# Patient Record
Sex: Female | Born: 1965
Health system: Southern US, Community
[De-identification: ages and names within clinical notes are randomized; demographics above are authoritative.]

## PROBLEM LIST (undated history)

## (undated) DIAGNOSIS — M2550 Pain in unspecified joint: Secondary | ICD-10-CM

## (undated) DIAGNOSIS — K5900 Constipation, unspecified: Secondary | ICD-10-CM

## (undated) HISTORY — DX: Constipation, unspecified: K59.00

## (undated) HISTORY — DX: Pain in unspecified joint: M25.50

---

## 2013-11-23 ENCOUNTER — Encounter: Payer: 59 | Attending: "Endocrinology | Admitting: Dietician

## 2013-11-23 DIAGNOSIS — E669 Obesity, unspecified: Secondary | ICD-10-CM | POA: Diagnosis not present

## 2013-11-23 DIAGNOSIS — Z713 Dietary counseling and surveillance: Secondary | ICD-10-CM | POA: Insufficient documentation

## 2013-11-23 NOTE — Progress Notes (Signed)
Patient was seen on 11/23/2013 for the Weight Loss Class at the [facility]. The following learning objectives were met by the patient during this class:   Describe healthy choices in each food group  Describe portion size of foods  Use plate method for meal planning  Demonstrate how to read Nutrition Facts food label  Set realistic goals for weight loss, diet changes, and physical activity.   Goals:  1. Make healthy food choices in each food group.  2. Reduce portion size of foods.  3. Increase fruit and vegetable intake.  4. Use plate method for meal planning.  5. Increase physical activity.   Handouts given:  1. Weight loss tips 2. Meal plan/portion card 2. Plate method  2. Food label handout

## 2013-12-31 ENCOUNTER — Encounter: Payer: 59 | Attending: "Endocrinology | Admitting: Dietician

## 2013-12-31 ENCOUNTER — Encounter: Payer: Self-pay | Admitting: Dietician

## 2013-12-31 VITALS — Ht 64.0 in | Wt 165.1 lb

## 2013-12-31 DIAGNOSIS — Z713 Dietary counseling and surveillance: Secondary | ICD-10-CM | POA: Diagnosis present

## 2013-12-31 DIAGNOSIS — E669 Obesity, unspecified: Secondary | ICD-10-CM | POA: Diagnosis not present

## 2013-12-31 DIAGNOSIS — E663 Overweight: Secondary | ICD-10-CM

## 2013-12-31 NOTE — Patient Instructions (Signed)
Follow Pre-Op Diet for 2-4 weeks. Add snacks with protein and carbohydrate if you are feeling hungry. When possible, try eating slowly, chewing well, and paying attention to hunger/fullness cues. Try to eat snacks when feeling hungry.  Try to sleep 7 hours per night.

## 2013-12-31 NOTE — Progress Notes (Signed)
  Medical Nutrition Therapy:  Appt start time: 469 end time:  6295.   Assessment:  Primary concerns today: Kelly Rivers is here today since she would like to lose weight. Currently going through menopause. Pretty active tends to eat more after work when she is busy. Used to weigh about 160 lbs and recently gained 5 lbs. Weight loss goal is about 150 lbs. Feels like she eats healthy 75% of the time.   Started working at [employer] 5 months ago and attended the Saturday Weight Management Class in August. 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 Lives husband and 2 kids at home. She does the food shopping and meal preparation at home. Does not miss or skip any meals. Eats out about 3-4 x week and has lunch at work [employer]. Has been eating the same way for 9 years. Just started working out with trainer and gained 5 lbs. Frustrated that scale has not moved.   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 Sleeps usually less than 7 hours a night.   Preferred Learning Style:   No preference indicated   Learning Readiness:   Ready  MEDICATIONS: none   DIETARY INTAKE:  Usual eating pattern includes 3 meals and 2 snacks per day.  Avoided foods include red meat, , pork, brussels sprouts, beets, lima beans, kale   24-hr recall:  B ( AM): 2 scrambled eggs or vanilla [place] yogurt with coffee with half and half and will juice vegetables and 1 apples 2-3 x week  Snk ( AM):none L ( PM): cafeteria will have salad with mixed greens, carrots, cucumbers, peppers, sunflower seeds, raisins, with ranch dressing and 2 chicken strips Snk ( PM): 4 pack of peanut butter crackers or crackers or yogurt or Kombucha (for constipation) Snk ( PM): 1/2 protein bar, or peanut butter cracker with 32 oz Diet Coke on her drive D ( PM): home - deer meat hamburger tacos, spaghetti, chicken or Wendy's chicken sandwich or McDonald's chicken salad  Snk ( PM): may have something sweet, popcorn, chips, pita chips  and humus, almonds, cashews Beverages: water, coffee, Diet Coke  Usual physical activity: tennis 3 x week for 1.5-2 hours, trainer 2 x week for 30 minutes, active at home   Estimated energy needs: 1800 calories 200 g carbohydrates 135 g protein 50 g fat  Progress Towards Goal(s):  In progress.   Nutritional Diagnosis:  [place]-3.3 Overweight/obesity As related to menopause, stress, and fast food dinners.  As evidenced by BMI of 28.3.    Intervention:  Nutrition counseling provided. Plan: Follow Pre-Op Diet for 2-4 weeks. Add snacks with protein and carbohydrate if you are feeling hungry. When possible, try eating slowly, chewing well, and paying attention to hunger/fullness cues. Try to eat snacks when feeling hungry.  Try to sleep 7 hours per night.   Teaching Method Utilized:  Visual Auditory Hands on  Handouts given during visit include:  Pre-OP Diet  Protein Shakes  Barriers to learning/adherence to lifestyle change: busy schedule, stress  Demonstrated degree of understanding via:  Teach Back   Monitoring/Evaluation:  Dietary intake, exercise, and body weight in 4 week(s).

## 2015-07-17 DIAGNOSIS — L570 Actinic keratosis: Secondary | ICD-10-CM | POA: Diagnosis not present

## 2015-07-17 DIAGNOSIS — L821 Other seborrheic keratosis: Secondary | ICD-10-CM | POA: Diagnosis not present

## 2015-07-17 DIAGNOSIS — D225 Melanocytic nevi of trunk: Secondary | ICD-10-CM | POA: Diagnosis not present

## 2015-07-17 DIAGNOSIS — L812 Freckles: Secondary | ICD-10-CM | POA: Diagnosis not present

## 2015-07-17 DIAGNOSIS — D485 Neoplasm of uncertain behavior of skin: Secondary | ICD-10-CM | POA: Diagnosis not present

## 2015-07-17 DIAGNOSIS — C44529 Squamous cell carcinoma of skin of other part of trunk: Secondary | ICD-10-CM | POA: Diagnosis not present

## 2016-06-21 DIAGNOSIS — Z1231 Encounter for screening mammogram for malignant neoplasm of breast: Secondary | ICD-10-CM | POA: Diagnosis not present

## 2016-06-21 DIAGNOSIS — Z124 Encounter for screening for malignant neoplasm of cervix: Secondary | ICD-10-CM | POA: Diagnosis not present

## 2016-06-21 DIAGNOSIS — Z01419 Encounter for gynecological examination (general) (routine) without abnormal findings: Secondary | ICD-10-CM | POA: Diagnosis not present

## 2016-07-14 DIAGNOSIS — D2271 Melanocytic nevi of right lower limb, including hip: Secondary | ICD-10-CM | POA: Diagnosis not present

## 2016-07-14 DIAGNOSIS — L814 Other melanin hyperpigmentation: Secondary | ICD-10-CM | POA: Diagnosis not present

## 2016-07-14 DIAGNOSIS — L812 Freckles: Secondary | ICD-10-CM | POA: Diagnosis not present

## 2016-07-14 DIAGNOSIS — L853 Xerosis cutis: Secondary | ICD-10-CM | POA: Diagnosis not present

## 2016-07-14 DIAGNOSIS — Z85828 Personal history of other malignant neoplasm of skin: Secondary | ICD-10-CM | POA: Diagnosis not present

## 2016-07-14 DIAGNOSIS — L821 Other seborrheic keratosis: Secondary | ICD-10-CM | POA: Diagnosis not present

## 2017-07-14 DIAGNOSIS — L821 Other seborrheic keratosis: Secondary | ICD-10-CM | POA: Diagnosis not present

## 2017-07-14 DIAGNOSIS — Z85828 Personal history of other malignant neoplasm of skin: Secondary | ICD-10-CM | POA: Diagnosis not present

## 2017-07-14 DIAGNOSIS — D225 Melanocytic nevi of trunk: Secondary | ICD-10-CM | POA: Diagnosis not present

## 2017-07-14 DIAGNOSIS — L812 Freckles: Secondary | ICD-10-CM | POA: Diagnosis not present

## 2017-10-06 ENCOUNTER — Ambulatory Visit (INDEPENDENT_AMBULATORY_CARE_PROVIDER_SITE_OTHER): Payer: Self-pay | Admitting: Medical

## 2017-10-06 ENCOUNTER — Encounter: Payer: Self-pay | Admitting: Medical

## 2017-10-06 VITALS — BP 106/66 | HR 71 | Temp 98.3°F | Resp 16 | Ht 64.0 in | Wt 173.0 lb

## 2017-10-06 DIAGNOSIS — Z0000 Encounter for general adult medical examination without abnormal findings: Secondary | ICD-10-CM

## 2017-10-06 NOTE — Patient Instructions (Signed)

## 2017-10-06 NOTE — Progress Notes (Signed)
Subjective:    Patient ID: Kelly Rivers, female    DOB: 1965-05-16, 52 y.o.   MRN: [id number]  HPI 52 yo female in non acute distress. Here for wellness exam. No complaints today.    No medical problems. No history of  Surgery.  Blood pressure 106/66, pulse 71, temperature 98.3 F (36.8 C), temperature source Oral, resp. rate 16, height 5\' 4"  (1.626 m), weight 173 lb (78.5 kg), SpO2 95 %.  Review of Systems  Constitutional: Negative.   HENT: Negative.   Eyes: Negative.   Respiratory: Negative.   Cardiovascular: Negative.   Gastrointestinal: Negative.   Endocrine: Negative.   Genitourinary: Negative.   Musculoskeletal: Positive for arthralgias (left knee sore from playing tennis this week played  6 days in a row).  Skin: Negative.   Allergic/Immunologic: Negative.   Neurological: Negative.   Hematological: Negative.   Psychiatric/Behavioral: Negative.        Objective:   Physical Exam  Constitutional: She is oriented to person, place, and time. Vital signs are normal. She appears well-developed and well-nourished.  HENT:  Head: Normocephalic and atraumatic.  Right Ear: Hearing, external ear and ear canal normal. No middle ear effusion.  Left Ear: Hearing, external ear and ear canal normal. A middle ear effusion is present.  Nose: Nose normal.  Mouth/Throat: Oropharynx is clear and moist.  Eyes: Pupils are equal, round, and reactive to light. Conjunctivae, EOM and lids are normal.  Neck: Trachea normal and normal range of motion. Neck supple.  Cardiovascular: Normal rate, regular rhythm, normal heart sounds and intact distal pulses.  Pulses:      Carotid pulses are 2+ on the right side.      Radial pulses are 2+ on the right side.       Popliteal pulses are 2+ on the right side, and 2+ on the left side.       Dorsalis pedis pulses are 2+ on the right side, and 2+ on the left side.       Posterior tibial pulses are 2+ on the right side.  Pulmonary/Chest: Effort  normal and breath sounds normal.  Abdominal: Soft. Bowel sounds are normal. There is no hepatosplenomegaly. Hernia confirmed negative in the ventral area.  Musculoskeletal: Normal range of motion.       Right shoulder: Normal.       Left shoulder: Normal.       Right elbow: Normal.      Left elbow: Normal.       Right wrist: Normal.       Left wrist: Normal.       Right hip: Normal.       Left hip: Normal.       Right knee: Normal.       Left knee: Normal.       Right ankle: Normal.       Left ankle: Normal.       Cervical back: Normal.       Thoracic back: Normal.       Lumbar back: Normal.       Right upper arm: Normal.       Left upper arm: Normal.       Right forearm: Normal.       Left forearm: Normal.       Left hand: Normal.       Right upper leg: Normal.       Left upper leg: Normal.       Right lower leg: Normal.  Left lower leg: Normal.  Lymphadenopathy:    She has no cervical adenopathy.  Neurological: She is alert and oriented to person, place, and time.  Skin: Skin is warm and dry. Capillary refill takes less than 2 seconds.  Psychiatric: She has a normal mood and affect. Her behavior is normal. Judgment and thought content normal.  Nursing note and vitals reviewed.         Assessment & Plan:  Wellness exam Follow up with your PCP for annual blood work and ob/gyn doctor for your well woman exam.  Patient verbalizes understanding and has no  Questions at discharge.

## 2017-10-08 ENCOUNTER — Encounter: Payer: Self-pay | Admitting: Dietician

## 2017-10-09 DIAGNOSIS — M1712 Unilateral primary osteoarthritis, left knee: Secondary | ICD-10-CM | POA: Diagnosis not present

## 2017-10-09 DIAGNOSIS — M25569 Pain in unspecified knee: Secondary | ICD-10-CM | POA: Diagnosis not present

## 2017-11-13 DIAGNOSIS — M1711 Unilateral primary osteoarthritis, right knee: Secondary | ICD-10-CM | POA: Diagnosis not present

## 2017-11-13 DIAGNOSIS — M25561 Pain in right knee: Secondary | ICD-10-CM | POA: Diagnosis not present

## 2018-02-20 DIAGNOSIS — Z01419 Encounter for gynecological examination (general) (routine) without abnormal findings: Secondary | ICD-10-CM | POA: Diagnosis not present

## 2018-02-20 DIAGNOSIS — Z1231 Encounter for screening mammogram for malignant neoplasm of breast: Secondary | ICD-10-CM | POA: Diagnosis not present

## 2018-02-20 DIAGNOSIS — R87615 Unsatisfactory cytologic smear of cervix: Secondary | ICD-10-CM | POA: Diagnosis not present

## 2018-02-20 DIAGNOSIS — Z1211 Encounter for screening for malignant neoplasm of colon: Secondary | ICD-10-CM | POA: Diagnosis not present

## 2018-02-20 DIAGNOSIS — Z124 Encounter for screening for malignant neoplasm of cervix: Secondary | ICD-10-CM | POA: Diagnosis not present

## 2018-04-19 DIAGNOSIS — M791 Myalgia, unspecified site: Secondary | ICD-10-CM | POA: Diagnosis not present

## 2018-04-19 DIAGNOSIS — R5383 Other fatigue: Secondary | ICD-10-CM | POA: Diagnosis not present

## 2018-04-19 DIAGNOSIS — Z13220 Encounter for screening for lipoid disorders: Secondary | ICD-10-CM | POA: Diagnosis not present

## 2018-06-15 DIAGNOSIS — M1711 Unilateral primary osteoarthritis, right knee: Secondary | ICD-10-CM | POA: Diagnosis not present

## 2018-07-13 DIAGNOSIS — Z1211 Encounter for screening for malignant neoplasm of colon: Secondary | ICD-10-CM | POA: Diagnosis not present

## 2018-10-02 DIAGNOSIS — Z124 Encounter for screening for malignant neoplasm of cervix: Secondary | ICD-10-CM | POA: Diagnosis not present

## 2018-10-02 DIAGNOSIS — R87610 Atypical squamous cells of undetermined significance on cytologic smear of cervix (ASC-US): Secondary | ICD-10-CM | POA: Diagnosis not present

## 2018-10-02 DIAGNOSIS — Z1231 Encounter for screening mammogram for malignant neoplasm of breast: Secondary | ICD-10-CM | POA: Diagnosis not present

## 2018-10-26 MED FILL — MELOXICAM 7.5 MG TABLET: 7.5 | 60 days supply | Qty: 60 | Fill #0

## 2018-11-06 ENCOUNTER — Other Ambulatory Visit: Payer: Self-pay | Admitting: Obstetrics and Gynecology

## 2018-11-06 DIAGNOSIS — Z1231 Encounter for screening mammogram for malignant neoplasm of breast: Secondary | ICD-10-CM

## 2018-12-19 ENCOUNTER — Other Ambulatory Visit: Payer: Self-pay

## 2018-12-19 ENCOUNTER — Ambulatory Visit
Admission: RE | Admit: 2018-12-19 | Discharge: 2018-12-19 | Disposition: A | Discharge status: Home / Self Care | Payer: 59 | Source: Ambulatory Visit | Attending: Obstetrics and Gynecology | Admitting: Obstetrics and Gynecology

## 2018-12-19 DIAGNOSIS — Z1231 Encounter for screening mammogram for malignant neoplasm of breast: Secondary | ICD-10-CM

## 2019-05-21 ENCOUNTER — Encounter: Payer: Self-pay | Admitting: Sports Medicine

## 2019-05-21 ENCOUNTER — Other Ambulatory Visit: Payer: Self-pay

## 2019-05-21 ENCOUNTER — Ambulatory Visit: Payer: 59 | Admitting: Sports Medicine

## 2019-05-21 VITALS — BP 118/84 | Ht 64.0 in | Wt 170.0 lb

## 2019-05-21 DIAGNOSIS — M1711 Unilateral primary osteoarthritis, right knee: Secondary | ICD-10-CM

## 2019-05-21 DIAGNOSIS — M25561 Pain in right knee: Secondary | ICD-10-CM

## 2019-05-21 NOTE — Progress Notes (Signed)
PCP: System, Pcp Not In  Subjective:   HPI: Patient is a 54 y.o. female here for evaluation of right knee pain.  Patient notes pain is been bothering her for the last several years.  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  She had x-rays done approximately 1 year ago showing arthritis of her knee.  She had a corticosteroid injection done at that time which provided her several months of benefit.  Patient is also been prescribed meloxicam in the past for her pain but she notes she does not like taking medications if possible.  Patient denies any mechanical symptoms such as locking or catching.  She denies any previous injury or trauma to her knee.   Review of Systems: See HPI above.  History reviewed. No pertinent past medical history.  No current outpatient medications on file prior to visit.   No current facility-administered medications on file prior to visit.    History reviewed. No pertinent surgical history.  No Known Allergies  Social History   Socioeconomic History  . Marital status: Married    Spouse name: Not on file  . Number of children: Not on file  . Years of education: Not on file  . Highest education level: Not on file  Occupational History  . Not on file  Tobacco Use  . Smoking status: Never Smoker  . Smokeless tobacco: Never Used  Substance and Sexual Activity  . Alcohol use: Never  . Drug use: Yes    Types: MDMA (Ecstacy)  . Sexual activity: Not on file  Other Topics Concern  . Not on file  Social History Narrative   ** Merged History Encounter **       Social Determinants of Health   Financial Resource Strain:   . Difficulty of Paying Living Expenses: Not on file  Food Insecurity:   . Worried About Charity fundraiser in the Last Year: Not on file  . Ran Out of Food in the Last Year: Not on file  Transportation Needs:   . Lack of Transportation (Medical): Not on file  . Lack  of Transportation (Non-Medical): Not on file  Physical Activity:   . Days of Exercise per Week: Not on file  . Minutes of Exercise per Session: Not on file  Stress:   . Feeling of Stress : Not on file  Social Connections:   . Frequency of Communication with Friends and Family: Not on file  . Frequency of Social Gatherings with Friends and Family: Not on file  . Attends Religious Services: Not on file  . Active Member of Clubs or Organizations: Not on file  . Attends Archivist Meetings: Not on file  . Marital Status: Not on file  Intimate Partner Violence:   . Fear of Current or Ex-Partner: Not on file  . Emotionally Abused: Not on file  . Physically Abused: Not on file  . Sexually Abused: Not on file    Family History  Problem Relation Age of Onset  . Hypertension Mother   . COPD Father   . Hypertension Brother         Objective:  Physical Exam: BP 118/84   Ht 5\' 4"  (1.626 m)   Wt 170 lb (77.1 kg)   BMI 29.18 kg/m  Gen: NAD, comfortable in exam room Lungs: Breathing comfortably on room air Knee Exam Right -Inspection: no deformity, no discoloration -Palpation: Tenderness palpation along medial  joint line -ROM: Extension: -10 degrees; Flexion: 130 degrees -Strength: Extension: 5/5; Flexion: 5/5 -Special Tests: Varus Stress: Negative; Valgus Stress: Negative;McMurray: Positive; Thessaly: Positive -Limb neurovascularly intact, no instability noted  Limited diagnostic ultrasound of the right knee Findings: -Normal appearance of the quadriceps and patellar tendon -No fluid noted within the suprapatellar pouch -Normal appearance of the medial lateral meniscus -Fluid seen surrounding the medial lateral meniscus -Spurring noted both in the medial lateral joint recess Impression: -Ultrasound findings consistent with arthritis of the right knee   Assessment & Plan:  Patient is a 54 y.o. female here for evaluation of right knee pain  1.  Right knee  osteoarthritis -Also findings consistent with right knee osteoarthritis -Patient was reassured that there is not a significant structural abnormality within her knee -Patient was offered corticosteroid injection however she declined this today.  She will likely return prior to the start of tennis season to get this done in May -Patient will continue to take over-the-counter anti-inflammatories as needed for pain -Patient will continue to wear her knee compression sleeve for stability -Patient was given home exercise program for knee strengthening -Patient will obtain x-rays prior to coming back in the spring for knee injection  Patient will follow up on an as-needed basis  Patient seen and evaluated with the sports medicine fellow.  I agree with the above plan of care.  Treatment as above.  Patient may want to return in May for cortisone injection prior to the start of tennis season.  We will put in a standing order for updated knee x-rays with instructions for her to have those x-rays done prior to that visit.  Otherwise, follow-up as needed.

## 2019-05-21 NOTE — Patient Instructions (Signed)
The pain in your knee is caused by arthritis.  I was able to see degenerative changes in the knee on the ultrasound.  On the ultrasound the meniscus of your knee appeared normal.  I did not see any significant swelling within the knee on the ultrasound. -Treatment for arthritis consists of treating your symptoms.  If you would like in the future we can offer a knee injection for you.  If you would like to get this done prior to the start of tennis season make an appointment at your convenience. -You may take over-the-counter NSAIDs intermittently as needed for pain -You may continue to use the knee compression sleeve while playing tennis.  If you would like to try different brace please let us know and we have them available -Work on the strengthening exercises shown to you at today's visit 3 to 4 days a week. -We have ordered an x-ray of your knee to better characterize the degree of arthritis that you have.  This is not urgent that you get it done however it would be useful to see updated x-rays prior to your next visit  We will see back on an as-needed basis.  If you desire the injections in the future please let us know

## 2019-10-14 DIAGNOSIS — C44519 Basal cell carcinoma of skin of other part of trunk: Secondary | ICD-10-CM | POA: Diagnosis not present

## 2019-10-14 DIAGNOSIS — D485 Neoplasm of uncertain behavior of skin: Secondary | ICD-10-CM | POA: Diagnosis not present

## 2019-10-14 DIAGNOSIS — C44311 Basal cell carcinoma of skin of nose: Secondary | ICD-10-CM | POA: Diagnosis not present

## 2019-10-14 DIAGNOSIS — C441121 Basal cell carcinoma of skin of right upper eyelid, including canthus: Secondary | ICD-10-CM | POA: Diagnosis not present

## 2019-10-14 DIAGNOSIS — L814 Other melanin hyperpigmentation: Secondary | ICD-10-CM | POA: Diagnosis not present

## 2019-10-14 DIAGNOSIS — D2271 Melanocytic nevi of right lower limb, including hip: Secondary | ICD-10-CM | POA: Diagnosis not present

## 2019-10-14 DIAGNOSIS — L812 Freckles: Secondary | ICD-10-CM | POA: Diagnosis not present

## 2019-10-14 DIAGNOSIS — Z85828 Personal history of other malignant neoplasm of skin: Secondary | ICD-10-CM | POA: Diagnosis not present

## 2019-10-14 DIAGNOSIS — D225 Melanocytic nevi of trunk: Secondary | ICD-10-CM | POA: Diagnosis not present

## 2019-11-27 DIAGNOSIS — E785 Hyperlipidemia, unspecified: Secondary | ICD-10-CM | POA: Diagnosis not present

## 2019-11-27 DIAGNOSIS — M791 Myalgia, unspecified site: Secondary | ICD-10-CM | POA: Diagnosis not present

## 2019-11-27 DIAGNOSIS — E559 Vitamin D deficiency, unspecified: Secondary | ICD-10-CM | POA: Diagnosis not present

## 2019-11-27 DIAGNOSIS — D559 Anemia due to enzyme disorder, unspecified: Secondary | ICD-10-CM | POA: Diagnosis not present

## 2019-11-27 DIAGNOSIS — E039 Hypothyroidism, unspecified: Secondary | ICD-10-CM | POA: Diagnosis not present

## 2020-10-20 IMAGING — MG MM DIGITAL SCREENING BILAT W/ TOMO W/ CAD
8 series · 8 of 24 positions shown · non-contrast
Comparison: Previous exam(s).

CLINICAL DATA: Screening.

EXAM:
DIGITAL SCREENING BILATERAL MAMMOGRAM WITH TOMO AND CAD

[R MLO synth-2D]
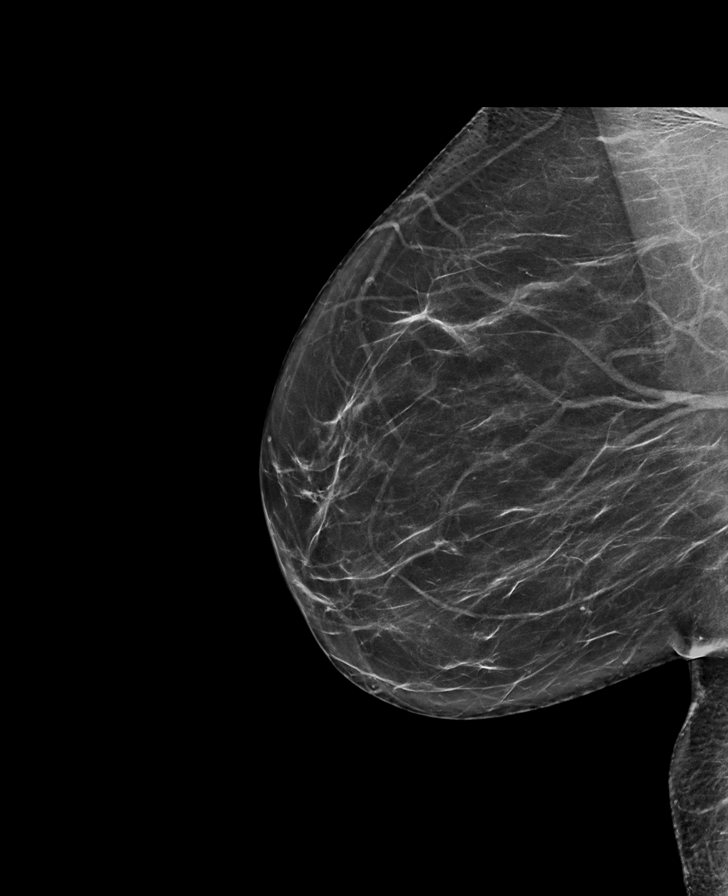

[L CC synth-2D]
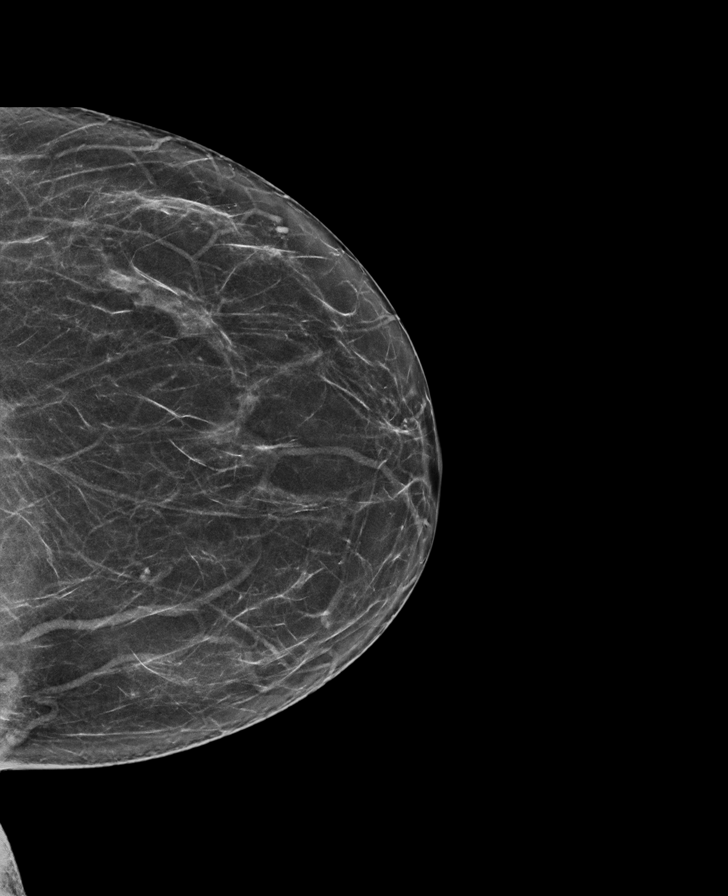

[L MLO synth-2D]
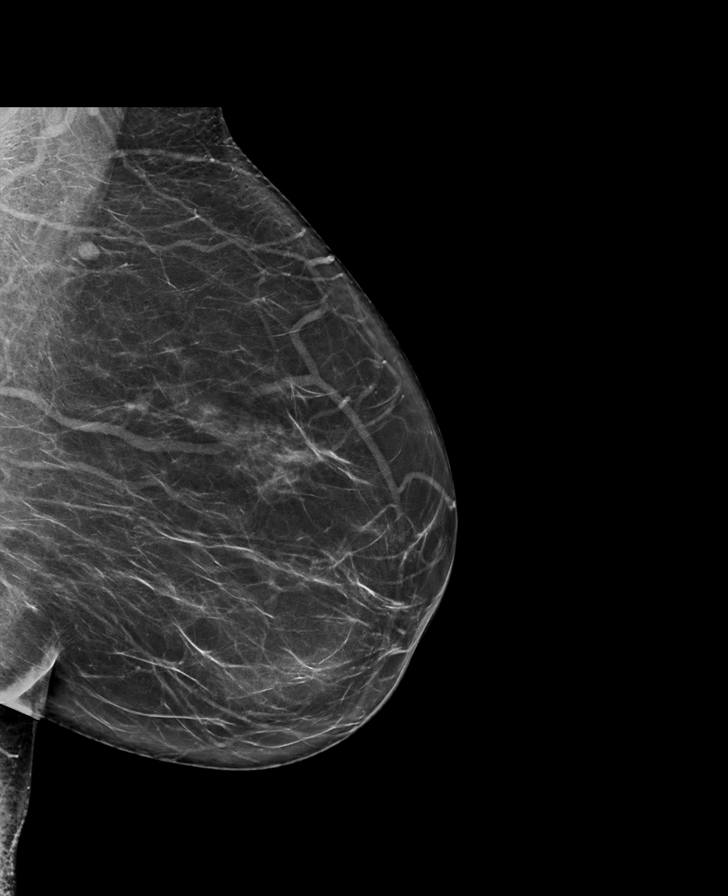

[R CC synth-2D]
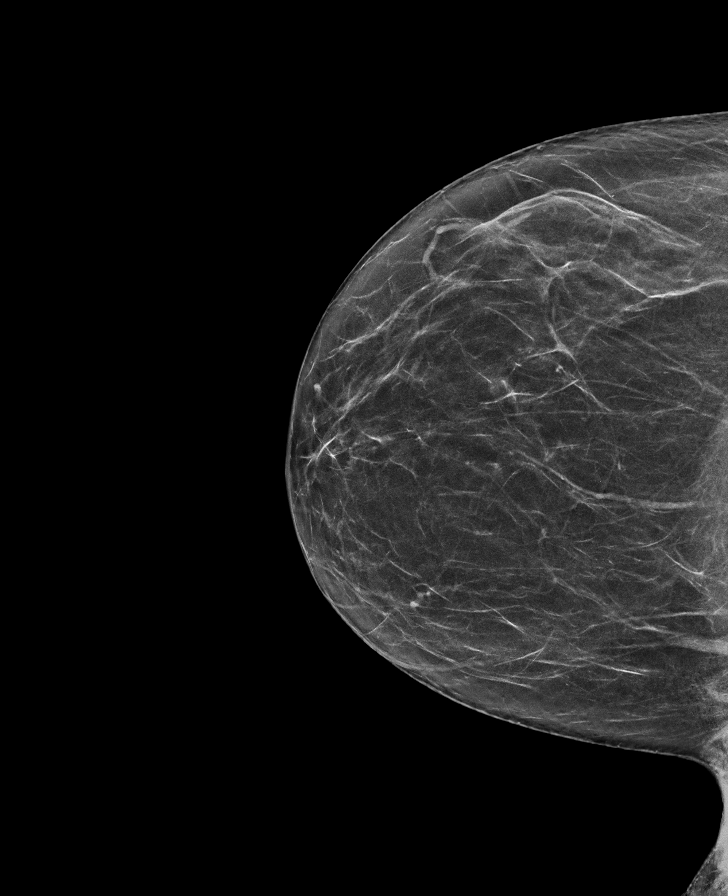

[R CC tomo · tomo slice 34/67.0]
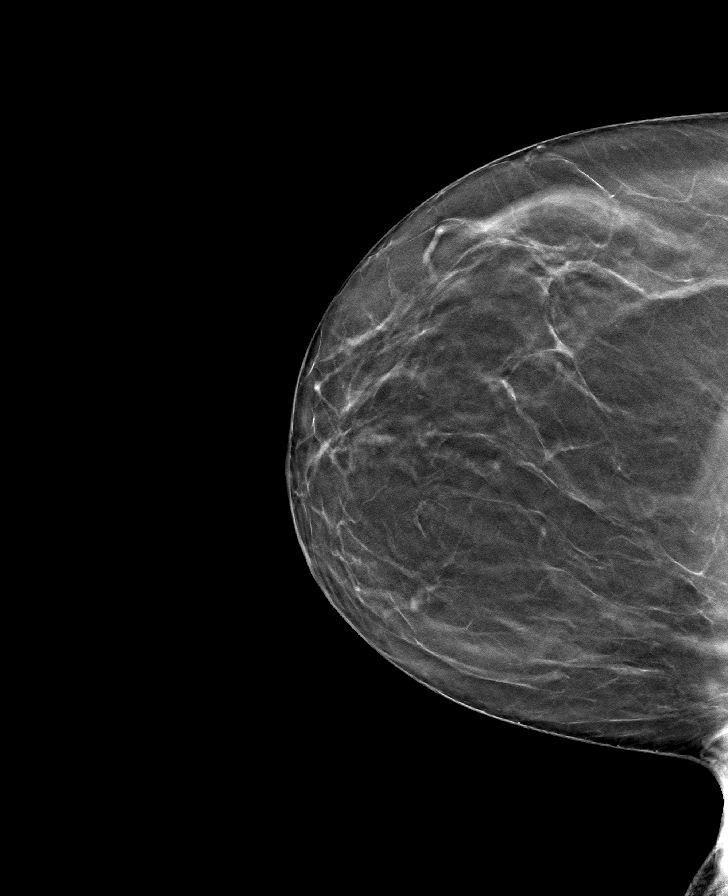

[L CC tomo · tomo slice 33/64.0]
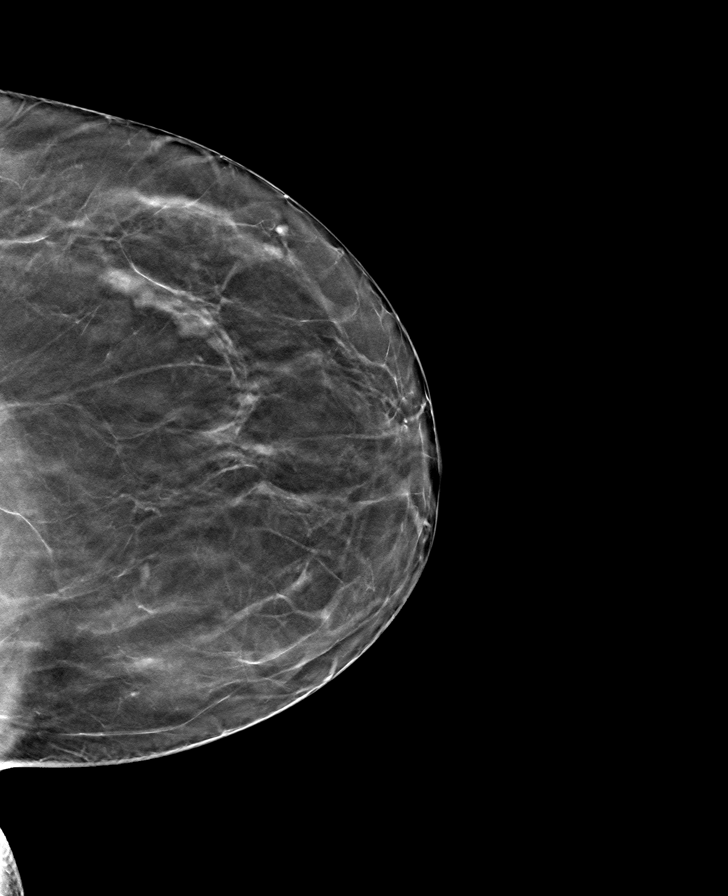

[L MLO tomo · tomo slice 39/76.0]
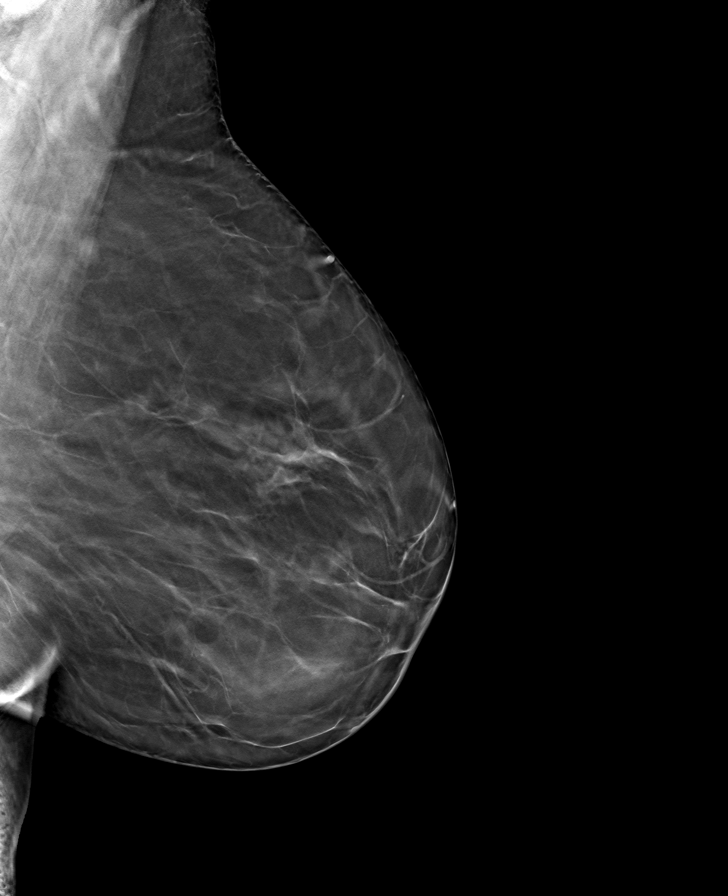

[R MLO tomo · tomo slice 37/72.0]
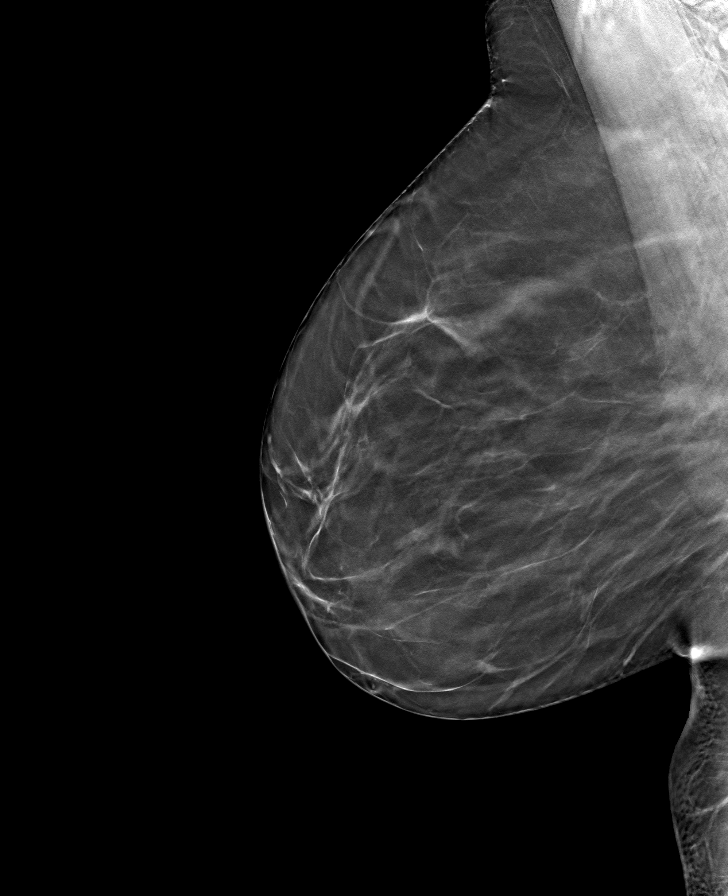

[8 of 24 positions shown; findings below may reference images not displayed]

ACR Breast Density Category b: There are scattered areas of
fibroglandular density.
FINDINGS: There are no findings suspicious for malignancy. Images were
processed with CAD.
IMPRESSION: No mammographic evidence of malignancy. A result letter of this
screening mammogram will be mailed directly to the patient.

RECOMMENDATION:
Screening mammogram in one year. (Code:[id number])

BI-RADS CATEGORY  1: Negative.

## 2021-04-27 DIAGNOSIS — Z124 Encounter for screening for malignant neoplasm of cervix: Secondary | ICD-10-CM | POA: Diagnosis not present

## 2021-04-27 DIAGNOSIS — R87619 Unspecified abnormal cytological findings in specimens from cervix uteri: Secondary | ICD-10-CM | POA: Diagnosis not present

## 2021-04-27 DIAGNOSIS — R5383 Other fatigue: Secondary | ICD-10-CM | POA: Diagnosis not present

## 2021-04-27 DIAGNOSIS — Z1231 Encounter for screening mammogram for malignant neoplasm of breast: Secondary | ICD-10-CM | POA: Diagnosis not present

## 2021-04-27 DIAGNOSIS — Z01419 Encounter for gynecological examination (general) (routine) without abnormal findings: Secondary | ICD-10-CM | POA: Diagnosis not present

## 2022-04-14 ENCOUNTER — Other Ambulatory Visit: Payer: Self-pay | Admitting: Obstetrics and Gynecology

## 2022-04-14 DIAGNOSIS — Z1231 Encounter for screening mammogram for malignant neoplasm of breast: Secondary | ICD-10-CM

## 2022-05-03 DIAGNOSIS — D2271 Melanocytic nevi of right lower limb, including hip: Secondary | ICD-10-CM | POA: Diagnosis not present

## 2022-05-03 DIAGNOSIS — Z85828 Personal history of other malignant neoplasm of skin: Secondary | ICD-10-CM | POA: Diagnosis not present

## 2022-05-03 DIAGNOSIS — D225 Melanocytic nevi of trunk: Secondary | ICD-10-CM | POA: Diagnosis not present

## 2022-05-03 DIAGNOSIS — L812 Freckles: Secondary | ICD-10-CM | POA: Diagnosis not present

## 2022-05-03 DIAGNOSIS — L821 Other seborrheic keratosis: Secondary | ICD-10-CM | POA: Diagnosis not present

## 2022-05-31 ENCOUNTER — Ambulatory Visit (INDEPENDENT_AMBULATORY_CARE_PROVIDER_SITE_OTHER): Payer: Commercial Managed Care - PPO | Admitting: Sports Medicine

## 2022-05-31 VITALS — BP 112/78 | Ht 64.0 in | Wt 164.0 lb

## 2022-05-31 DIAGNOSIS — M25559 Pain in unspecified hip: Secondary | ICD-10-CM | POA: Diagnosis not present

## 2022-05-31 NOTE — Progress Notes (Unsigned)
  Kelly Rivers - 57 y.o. female MRN [id number]  Date of birth: [birth year]  SUBJECTIVE:  Including CC & ROS.  CC: Left hip pain  HPI: Patient is presenting for evaluation of left hip pain. This has been going on for 9 months, no known inciting event. She plays a lot of tennis but stopped playing for the past 3 months. She returned to the court on Sunday but still found the same pain. 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 Described as a deep stretch in her lateral hip and thigh, especially most painful at her lateral hip joint.  Has been doing massages, not taking any NSAIDs regularly. Denies numbness or tingling. Denies weakness, but does feel fatigued from pain.    HISTORY: Past Medical, Surgical, Social, and Family History Reviewed & Updated per EMR.   Pertinent Historical Findings include: none  PHYSICAL EXAM:  VS: BP:112/78  HR: bpm  TEMP: ( )  RESP:   HT:'5\' 4"'$  (162.6 cm)   WT:164 lb (74.4 kg)  BMI:28.14 PHYSICAL EXAM: Hip:  - Inspection: No gross deformity, no swelling, erythema, or ecchymosis - Palpation: TTP over greater trochanter. No other TTP - ROM: Normal range of motion on Flexion, extension, abduction, internal and external rotation. Less ROM on L external rotation compared to R.  - Strength: Normal strength except 4/5 L hip abduction. - Neuro/vasc: NV intact distally - Special Tests: Negative FABER and FADIR. Trendelenberg with small drop of R pelvis when standing on L leg.  ASSESSMENT & PLAN: See problem based charting & AVS for pt instructions.  L Hip Pain  Greater Trochanteric Pain Syndrome - patient's symptoms of lateral hip pain at greater trochanter worsened with hip abduction is most consistent with GTPS. Low concern for intraarticular pathology. Provided hip stabilizing exercises. Can continue other activity as tolerated. Follow up in 6 weeks. If no  improvement or worsening, can consider formal PT vs x-ray to evaluate for other causes.   Estevan Oaks, MS4 [facility]  Patient seen and evaluated with the medical student.  I agree with the above plan of care.  Home exercise program as above and follow-up with me again in 6 weeks.  If no improvement is noted at follow-up, consider x-rays at that time as well as referral to formal physical therapy.

## 2022-06-03 ENCOUNTER — Ambulatory Visit: Payer: Commercial Managed Care - PPO

## 2022-06-03 ENCOUNTER — Ambulatory Visit
Admission: RE | Admit: 2022-06-03 | Discharge: 2022-06-03 | Disposition: A | Discharge status: Home / Self Care | Payer: Commercial Managed Care - PPO | Source: Ambulatory Visit | Attending: Obstetrics and Gynecology | Admitting: Obstetrics and Gynecology

## 2022-06-03 DIAGNOSIS — Z1231 Encounter for screening mammogram for malignant neoplasm of breast: Secondary | ICD-10-CM | POA: Diagnosis not present

## 2022-06-28 DIAGNOSIS — Z1211 Encounter for screening for malignant neoplasm of colon: Secondary | ICD-10-CM | POA: Diagnosis not present

## 2022-06-28 DIAGNOSIS — R5383 Other fatigue: Secondary | ICD-10-CM | POA: Diagnosis not present

## 2022-06-28 DIAGNOSIS — Z01419 Encounter for gynecological examination (general) (routine) without abnormal findings: Secondary | ICD-10-CM | POA: Diagnosis not present

## 2022-07-12 ENCOUNTER — Ambulatory Visit: Payer: Commercial Managed Care - PPO | Admitting: Sports Medicine

## 2022-07-12 VITALS — BP 122/84

## 2022-07-12 DIAGNOSIS — M25559 Pain in unspecified hip: Secondary | ICD-10-CM | POA: Diagnosis not present

## 2022-07-13 ENCOUNTER — Ambulatory Visit
Admission: RE | Admit: 2022-07-13 | Discharge: 2022-07-13 | Disposition: A | Discharge status: Home / Self Care | Payer: Commercial Managed Care - PPO | Source: Ambulatory Visit | Attending: Sports Medicine | Admitting: Sports Medicine

## 2022-07-13 DIAGNOSIS — M5137 Other intervertebral disc degeneration, lumbosacral region: Secondary | ICD-10-CM | POA: Diagnosis not present

## 2022-07-13 DIAGNOSIS — M545 Low back pain, unspecified: Secondary | ICD-10-CM | POA: Diagnosis not present

## 2022-07-13 DIAGNOSIS — M25559 Pain in unspecified hip: Secondary | ICD-10-CM

## 2022-07-13 NOTE — Progress Notes (Signed)
   Subjective:    Patient ID: Kelly Rivers, female    DOB: 1965/10/19, 57 y.o.   MRN: [id number]  HPI  Kelly Rivers presents today for follow-up on left hip pain.  Unfortunately, it is not improving.  She has now noticed that the pain begins in the posterior hip and will radiate around the lateral hip down to the knee.  It does not radiate past the knee.  It is not associated with numbness or tingling.  She has not noticed any weakness but does describe a feeling of "heaviness" in the leg.  Pain is worse with walking.  She continues to deny groin pain.   Review of Systems As above    Objective:   Physical Exam  Well-developed, well-nourished.  No acute distress  Lumbar spine: She has excellent lumbar flexion and extension.  She does describe some mild discomfort with forward flexion.  None with extension.  No tenderness to palpation along the lumbar midline.  There is some tenderness to palpation in the area of the SI joint.  Left hip: Smooth painless hip range of motion with a negative logroll.  No tenderness to palpation laterally.  Positive Corky Sox.  Neurological exam: Her reflexes are equal at the Achilles and patellar tendons bilaterally.  She does demonstrate a slight amount of weakness with resisted great toe extension on the left when compared to the right but the remainder of her strength is 5/5 bilaterally.  No noticeable atrophy.      Assessment & Plan:   Persistent left hip and leg pain  I feel like her symptoms may actually be originating either from her SI joint or from atypical lumbar radiculopathy.  I would like to start with some x-rays of her lumbar spine as well as an AP of her pelvis.  I will call her with those results when available.  I will likely recommend physical therapy at that point.  This note was dictated using Dragon naturally speaking software and may contain errors in syntax, spelling, or content which have not been identified prior to signing this note.

## 2022-07-25 ENCOUNTER — Other Ambulatory Visit: Payer: Self-pay

## 2022-07-25 ENCOUNTER — Telehealth: Payer: Self-pay | Admitting: Sports Medicine

## 2022-07-25 DIAGNOSIS — M4317 Spondylolisthesis, lumbosacral region: Secondary | ICD-10-CM

## 2022-07-25 NOTE — Telephone Encounter (Signed)
  I spoke with Kelly Rivers on the phone today after reviewing x-rays of her lumbar spine and pelvis.  She has a grade 1 spondylolisthesis at L5 on S1.  SI joints show no significant arthritic change.  She has a mild amount of left hip osteoarthritis but not severe.  Based on these findings, I do think her symptoms are likely originating from her lumbar spine.  I will refer her for physical therapy at [facility] outpatient PT with instructions for them to focus on Williams flexion type exercises and to avoid lumbar extension.  She will then follow-up with me 4 weeks after starting PT.

## 2022-08-01 ENCOUNTER — Encounter: Payer: Self-pay | Admitting: Sports Medicine

## 2022-08-10 NOTE — Therapy (Addendum)
OUTPATIENT PHYSICAL THERAPY THORACOLUMBAR EVALUATION/DC   Patient Name: Kelly Rivers MRN: [id number] DOB:1966-03-13, 57 y.o., female Today's Date: 08/12/2022  END OF SESSION:  PT End of Session - 08/11/22 1133     Visit Number 1    Number of Visits 13    Date for PT Re-Evaluation 09/30/22    Authorization Type [employer] P[employer]    PT Start Time 0719    PT Stop Time 0805    PT Time Calculation (min) 46 min    Activity Tolerance Patient tolerated treatment well    Behavior During Therapy [facility] for tasks assessed/performed             No past medical history on file. No past surgical history on file. There are no problems to display for this patient.   PCP: Deucher, Doris Cheadle, MD  REFERRING PROVIDER: Ralene Cork, DO  REFERRING DIAG: [phone] (ICD-10-CM) - Spondylolisthesis at L5-S1 level   Rationale for Evaluation and Treatment: Rehabilitation  THERAPY DIAG:  Chronic left-sided low back pain with left-sided sciatica  Difficulty in walking, not elsewhere classified  ONSET DATE: 9 mnths  SUBJECTIVE:                                                                                                                                                                                           SUBJECTIVE STATEMENT: Pt reports the pain initially started along her L LE to just below her knee and then began to occur in the L gluteal and ant/lat hip.   PERTINENT HISTORY:  No significant history  PAIN:  Are you having pain? Yes: NPRS scale: 4/10 Pain location: L lateral thigh and ant/lat hip Pain description: sharp Aggravating factors: Sit to standing after prolonged sitting, tennis with use of advil Relieving factors: Advil, getting up and moving around Pt also has brief episodes of higher pain  PRECAUTIONS: None  WEIGHT BEARING RESTRICTIONS: No  FALLS:  Has patient fallen in last 6 months? No  LIVING ENVIRONMENT: Lives with: lives with their family Lives in:  House/apartment No issue with accessing or mobility within home   OCCUPATION: Nurse- Admin  PLOF: Independent  PATIENT GOALS: Less pain  NEXT MD VISIT: 4 weeks  OBJECTIVE:   DIAGNOSTIC FINDINGS:  07/14/2022  FINDINGS: Grade 1 anterolisthesis L5 on S1. Probable bilateral L5 pars defects. Multilevel degenerative disc disease most pronounced at L5-S1. SI joints unremarkable. Stool throughout the colon. Pelvic phleboliths.   IMPRESSION: Grade 1 anterolisthesis L5 on S1 with probable bilateral L5 pars defects.  PATIENT SURVEYS:  FOTO: Perceived function   58%, predicted   70%   SCREENING  FOR RED FLAGS: Bowel or bladder incontinence: No Spinal tumors: No Cauda equina syndrome: No Compression fracture:   COGNITION: Overall cognitive status: Within functional limits for tasks assessed     SENSATION: WFL  MUSCLE LENGTH: Hamstrings: Right WNLs deg; Left WNLs deg Maisie Fus test: Right NT deg; Left NT deg  POSTURE: forward head  PALPATION: TTP peri-L lateral greater trochanter and anterior lateral hip  LUMBAR ROM:  No reproduction of pain AROM eval  Flexion WNLs  Extension WNLs  Right lateral flexion WNLs  Left lateral flexion WNLs  Right rotation WNLs  Left rotation WNLs   (Blank rows = not tested)  LOWER EXTREMITY ROM:     Grossly WNLs for bilat hip PROMs Active  Right eval Left eval  Hip flexion    Hip extension    Hip abduction    Hip adduction    Hip internal rotation    Hip external rotation    Knee flexion    Knee extension    Ankle dorsiflexion    Ankle plantarflexion    Ankle inversion    Ankle eversion     (Blank rows = not tested)  LOWER EXTREMITY MMT:    MMT Right eval Left eval  Hip flexion 4+ 4+  Hip extension    Hip abduction 4+ 4+ greater trochanter  Hip adduction    Hip internal rotation    Hip external rotation 4+ 4+  Knee flexion    Knee extension    Ankle dorsiflexion    Ankle plantarflexion    Ankle inversion     Ankle eversion     (Blank rows = not tested)  LUMBAR SPECIAL TESTS:  Straight leg raise test: Negative, Slump test: Negative, and FABER test: Negative  For the L LE-L lateral thigh pain, not of the groin.  FUNCTIONAL TESTS:  NT  GAIT: Distance walked: 200' Assistive device utilized: None Level of assistance: Complete Independence Comments: WNLs  TODAY'S TREATMENT:                                                                                                                               [facility] Adult PT Treatment:                                                DATE: 08/11/22 Therapeutic Exercise: Developed, instructed in, and pt completed therex as noted in HEP  PATIENT EDUCATION:  Education details: Eval findings, POC, HEP Person educated: Patient Education method: Explanation, Demonstration, Tactile cues, Verbal cues, and Handouts Education comprehension: verbalized understanding, returned demonstration, verbal cues required, and tactile cues required  HOME EXERCISE PROGRAM: Access Code: [email or web address] URL: [email or web address] Date: 08/11/2022 Prepared by: Joellyn Rued  Exercises - Hooklying Single Knee to Chest Stretch  - 2 x daily - 7 x weekly - 1 sets -  2-3 reps - 20 hold - Supine Double Knee to Chest  - 2 x daily - 7 x weekly - 1 sets - 2-3 reps - 20 hold - Prone Hip Extension  - 1 x daily - 7 x weekly - 2 sets - 10 reps - 3 hold - Prone Alternating Arm and Leg Lifts  - 1 x daily - 7 x weekly - 2 sets - 10 reps - 3 hold  ASSESSMENT:  CLINICAL IMPRESSION: Patient is a 57 y.o. female who was seen today for physical therapy evaluation and treatment for M43.17 (ICD-10-CM) - Spondylolisthesis at L5-S1 level. Pt's was most symptomatic of L FABER testing which seemed related to tension of the iliopsoas on the lumbar vertrbrae vs. Articular pain. Pt also experienced L lateral hip pain c resisted hip abd and was tender to palpation around the L greater trochanter. PT  was initiated for lumbar ROM c flexion bias and for back strengthening with the spine in a neutral position. Pt will benefit from skilled PT for 2w6 to address impairments to optimize function with less pain.    OBJECTIVE IMPAIRMENTS: difficulty walking and pain.   ACTIVITY LIMITATIONS: sitting, standing, and locomotion level  PARTICIPATION LIMITATIONS:  Recreation- tennis 1-2x/week  PERSONAL FACTORS: Past/current experiences and Time since onset of injury/illness/exacerbation are also affecting patient's functional outcome.   REHAB POTENTIAL: Good  CLINICAL DECISION MAKING: Evolving/moderate complexity  EVALUATION COMPLEXITY: Moderate   GOALS:  SHORT TERM GOALS=LTGs  LONG TERM GOALS: Target date: 09/30/22  Pt will be Ind in a final HEP to maintain achieved LOF  Baseline: initiated Goal status: INITIAL  2.  Pt will report a 75% improvement in pain with her daily activities and recreational pursuits Baseline: 4/10 c episode of brief pain at a higher level Goal status: INITIAL  3.  Pt's FOTO score will improved to the predicted value of 70% as indication of improved function  Baseline: 58% Goal status: INITIAL  PLAN:  PT FREQUENCY: 2x/week  PT DURATION: 6 weeks  PLANNED INTERVENTIONS: Therapeutic exercises, Therapeutic activity, Gait training, Patient/Family education, Self Care, Joint mobilization, Aquatic Therapy, Dry Needling, Electrical stimulation, Spinal mobilization, Cryotherapy, Moist heat, Taping, Traction, Ultrasound, Ionotophoresis 4mg /ml Dexamethasone, Manual therapy, and Re-evaluation.  PLAN FOR NEXT SESSION: Review FOTO; assess response to HEP; progress therex as indicated; use of modalities, manual therapy; and TPDN as indicated.  Kerriann Kamphuis MS, PT 08/12/22 6:05 AM  PHYSICAL THERAPY DISCHARGE SUMMARY  Visits from Start of Care: 1  Current functional level related to goals / functional outcomes: unknown   Remaining deficits: unknown   Education /  Equipment: HEP   Patient agrees to discharge. Patient goals were not met. Patient is being discharged due to not returning since the last visit.

## 2022-08-11 ENCOUNTER — Other Ambulatory Visit: Payer: Self-pay

## 2022-08-11 ENCOUNTER — Ambulatory Visit: Payer: Commercial Managed Care - PPO | Attending: Sports Medicine

## 2022-08-11 DIAGNOSIS — M4317 Spondylolisthesis, lumbosacral region: Secondary | ICD-10-CM | POA: Diagnosis not present

## 2022-08-11 DIAGNOSIS — M5442 Lumbago with sciatica, left side: Secondary | ICD-10-CM | POA: Insufficient documentation

## 2022-08-11 DIAGNOSIS — R262 Difficulty in walking, not elsewhere classified: Secondary | ICD-10-CM

## 2022-08-11 DIAGNOSIS — G8929 Other chronic pain: Secondary | ICD-10-CM | POA: Diagnosis not present

## 2022-09-06 ENCOUNTER — Ambulatory Visit: Payer: Commercial Managed Care - PPO

## 2022-09-07 ENCOUNTER — Encounter: Payer: Self-pay | Admitting: Sports Medicine

## 2022-09-14 DIAGNOSIS — M5416 Radiculopathy, lumbar region: Secondary | ICD-10-CM | POA: Diagnosis not present

## 2022-09-16 DIAGNOSIS — M5416 Radiculopathy, lumbar region: Secondary | ICD-10-CM | POA: Diagnosis not present

## 2022-09-17 DIAGNOSIS — Z1211 Encounter for screening for malignant neoplasm of colon: Secondary | ICD-10-CM | POA: Diagnosis not present

## 2022-09-21 ENCOUNTER — Ambulatory Visit: Payer: Commercial Managed Care - PPO

## 2022-11-18 ENCOUNTER — Encounter: Payer: Commercial Managed Care - PPO | Attending: Internal Medicine | Admitting: Skilled Nursing Facility1

## 2022-11-18 ENCOUNTER — Encounter: Payer: Self-pay | Admitting: Skilled Nursing Facility1

## 2022-11-18 VITALS — Ht 64.0 in | Wt 174.3 lb

## 2022-11-18 DIAGNOSIS — E631 Imbalance of constituents of food intake: Secondary | ICD-10-CM | POA: Insufficient documentation

## 2022-11-18 NOTE — Progress Notes (Addendum)
Medical Nutrition Therapy   Primary concerns today: to lose weight   Referral diagnosis: [employer] employee   NUTRITION ASSESSMENT   Clinical Medical Hx:  Medications: see list Labs:  Notable Signs/Symptoms: none reported   Lifestyle & Dietary Hx   Pt states she is a stress eater. Pt states she lives an hour from work.  Pt states she has tried bringing her meals to work but falls out of that behavior.  Pt states she is not big on leftovers. Pt states she is not a lunch meat person. Pt will eat lunch and breakfast at work cafeteria but sometimes finds it difficult to find food she likes consistently.   Body Composition Scale 11/18/2022  Current Body Weight 174.3  Total Body Fat % 36.8  Visceral Fat 10  Fat-Free Mass % 63.1   Total Body Water % 46  Muscle-Mass lbs 29.4  BMI 29.7  Body Fat Displacement          Torso  lbs 39.6         Left Leg  lbs 7.9         Right Leg  lbs 7.9         Left Arm  lbs 3.9         Right Arm   lbs 3.9      Supplements: none Stress / self-care: high stress work environment Current average weekly physical activity: 2-3 times a week tennis  24-Hr Dietary Recall: up at 4am and leaves for work at 6 and leaving work at [employer] 10: scrambled eggs and oatmeal Snack:  Second Meal 2: salad bar Snack:  Third Meal: fast food Snack:  Beverages: 1 diet coke,   Estimated Energy Needs Calories: 1500-1700  NUTRITION DIAGNOSIS  NB-1.3 Not ready for diet/lifestyle change  As related to emotional barriers.  As evidenced by pt's report of uncertainty regarding implementing desired dietary/lifestyle changes.   NUTRITION INTERVENTION  Nutrition education (E-1) on the following topics:  Encouraged developing consistency in eating schedule to prevent going too long without eating Discussed options for minimal-prep dinners for busy nights to aid in reducing fast food consumption Encouraged pt to reflect on meeting emotional/personal needs  and goals regarding making food decisions  Learning Style & Readiness for Change Teaching method utilized: Visual & Auditory  Demonstrated degree of understanding via: Teach Back  Barriers to learning/adherence to lifestyle change: Busy schedule and structuring work/life balance  Goals Established by Pt Preparing dinners or having quick options to limit fast food Maintaining a consistent eating schedule Adding a snack in the day if needed   MONITORING & EVALUATION Dietary intake, weekly physical activity, and perceived wellness.  Next Steps  Patient is to schedule second [employer] employee appointment.

## 2023-05-05 ENCOUNTER — Other Ambulatory Visit (HOSPITAL_COMMUNITY)
Admission: AD | Admit: 2023-05-05 | Discharge: 2023-05-05 | Disposition: A | Discharge status: Home / Self Care | Payer: Commercial Managed Care - PPO | Source: Ambulatory Visit | Attending: Internal Medicine | Admitting: Internal Medicine

## 2023-05-05 DIAGNOSIS — E559 Vitamin D deficiency, unspecified: Secondary | ICD-10-CM | POA: Diagnosis not present

## 2023-05-05 DIAGNOSIS — E039 Hypothyroidism, unspecified: Secondary | ICD-10-CM | POA: Diagnosis not present

## 2023-05-05 DIAGNOSIS — E119 Type 2 diabetes mellitus without complications: Secondary | ICD-10-CM | POA: Diagnosis not present

## 2023-05-05 DIAGNOSIS — N390 Urinary tract infection, site not specified: Secondary | ICD-10-CM | POA: Insufficient documentation

## 2023-05-05 DIAGNOSIS — D559 Anemia due to enzyme disorder, unspecified: Secondary | ICD-10-CM | POA: Diagnosis not present

## 2023-05-05 LAB — LIPID PANEL
Cholesterol: 206 mg/dL — ABNORMAL HIGH (ref 0–200)
HDL: 54 mg/dL (ref >40)
LDL Cholesterol: 142 mg/dL — ABNORMAL HIGH (ref 0–99)
Total CHOL/HDL Ratio: 3.8 {ratio}
Triglycerides: 48 mg/dL (ref <150)
VLDL: 10 mg/dL (ref 0–40)

## 2023-05-05 LAB — COMPREHENSIVE METABOLIC PANEL
ALT: 19 U/L (ref 0–44)
AST: 21 U/L (ref 15–41)
Albumin: 3.9 g/dL (ref 3.5–5.0)
Alkaline Phosphatase: 55 U/L (ref 38–126)
Anion gap: 6 (ref 5–15)
BUN: 12 mg/dL (ref 6–20)
CO2: 28 mmol/L (ref 22–32)
Calcium: 9.9 mg/dL (ref 8.9–10.3)
Chloride: 106 mmol/L (ref 98–111)
Creatinine, Ser: 0.88 mg/dL (ref 0.44–1.00)
GFR, Estimated: >60 mL/min (ref >60)
Glucose, Bld: 93 mg/dL (ref 70–99)
Potassium: 4.1 mmol/L (ref 3.5–5.1)
Sodium: 140 mmol/L (ref 135–145)
Total Bilirubin: 0.9 mg/dL (ref 0.0–1.2)
Total Protein: 6.9 g/dL (ref 6.5–8.1)

## 2023-05-05 LAB — CBC
HCT: 39.6 % (ref 36.0–46.0)
Hemoglobin: 13 g/dL (ref 12.0–15.0)
MCH: 29.9 pg (ref 26.0–34.0)
MCHC: 32.8 g/dL (ref 30.0–36.0)
MCV: 91 fL (ref 80.0–100.0)
Platelets: 299 10*3/uL (ref 150–400)
RBC: 4.35 MIL/uL (ref 3.87–5.11)
RDW: 11.7 % (ref 11.5–15.5)
WBC: 6.1 10*3/uL (ref 4.0–10.5)
nRBC: 0 % (ref 0.0–0.2)

## 2023-05-05 LAB — TSH: TSH: 3.128 u[IU]/mL (ref 0.350–4.500)

## 2023-05-05 LAB — HEMOGLOBIN A1C
Hgb A1c MFr Bld: 5.2 % (ref 4.8–5.6)
Mean Plasma Glucose: 102.54 mg/dL

## 2023-05-05 LAB — VITAMIN D 25 HYDROXY (VIT D DEFICIENCY, FRACTURES): Vit D, 25-Hydroxy: 18.34 ng/mL — ABNORMAL LOW (ref 30–100)

## 2023-06-06 DIAGNOSIS — L812 Freckles: Secondary | ICD-10-CM | POA: Diagnosis not present

## 2023-06-06 DIAGNOSIS — L853 Xerosis cutis: Secondary | ICD-10-CM | POA: Diagnosis not present

## 2023-06-06 DIAGNOSIS — Z85828 Personal history of other malignant neoplasm of skin: Secondary | ICD-10-CM | POA: Diagnosis not present

## 2023-06-06 DIAGNOSIS — L821 Other seborrheic keratosis: Secondary | ICD-10-CM | POA: Diagnosis not present

## 2023-06-06 DIAGNOSIS — L570 Actinic keratosis: Secondary | ICD-10-CM | POA: Diagnosis not present

## 2023-08-09 DIAGNOSIS — R87610 Atypical squamous cells of undetermined significance on cytologic smear of cervix (ASC-US): Secondary | ICD-10-CM | POA: Diagnosis not present

## 2023-08-09 DIAGNOSIS — Z124 Encounter for screening for malignant neoplasm of cervix: Secondary | ICD-10-CM | POA: Diagnosis not present

## 2023-08-09 DIAGNOSIS — N952 Postmenopausal atrophic vaginitis: Secondary | ICD-10-CM | POA: Diagnosis not present

## 2023-08-09 DIAGNOSIS — Z01419 Encounter for gynecological examination (general) (routine) without abnormal findings: Secondary | ICD-10-CM | POA: Diagnosis not present

## 2023-08-10 ENCOUNTER — Other Ambulatory Visit: Payer: Self-pay | Admitting: Internal Medicine

## 2023-08-10 DIAGNOSIS — Z1231 Encounter for screening mammogram for malignant neoplasm of breast: Secondary | ICD-10-CM

## 2023-08-17 ENCOUNTER — Ambulatory Visit
Admission: RE | Admit: 2023-08-17 | Discharge: 2023-08-17 | Disposition: A | Discharge status: Home / Self Care | Source: Ambulatory Visit | Attending: Internal Medicine | Admitting: Internal Medicine

## 2023-08-17 DIAGNOSIS — Z1231 Encounter for screening mammogram for malignant neoplasm of breast: Secondary | ICD-10-CM | POA: Diagnosis not present

## 2024-02-15 ENCOUNTER — Ambulatory Visit (INDEPENDENT_AMBULATORY_CARE_PROVIDER_SITE_OTHER): Payer: Self-pay | Admitting: Adult Health

## 2024-02-15 ENCOUNTER — Ambulatory Visit (INDEPENDENT_AMBULATORY_CARE_PROVIDER_SITE_OTHER): Payer: Self-pay

## 2024-02-15 ENCOUNTER — Encounter (INDEPENDENT_AMBULATORY_CARE_PROVIDER_SITE_OTHER): Payer: Self-pay | Admitting: Adult Health

## 2024-02-15 VITALS — BP 110/75 | HR 84 | Temp 98.0°F | Ht 63.0 in | Wt 170.0 lb

## 2024-02-15 DIAGNOSIS — Z0000 Encounter for general adult medical examination without abnormal findings: Secondary | ICD-10-CM

## 2024-02-15 DIAGNOSIS — E669 Obesity, unspecified: Secondary | ICD-10-CM | POA: Diagnosis not present

## 2024-02-15 DIAGNOSIS — Z6830 Body mass index (BMI) 30.0-30.9, adult: Secondary | ICD-10-CM | POA: Diagnosis not present

## 2024-02-15 DIAGNOSIS — Z0289 Encounter for other administrative examinations: Secondary | ICD-10-CM

## 2024-02-15 NOTE — Progress Notes (Signed)
 Office: [phone]  /  Fax: [phone]   Initial Visit    Kelly Rivers was seen in clinic today to evaluate for obesity. She is interested in losing weight to improve overall health and reduce the risk of weight related complications. She presents today to review program treatment options, initial physical assessment, and evaluation.     She was referred by: Self-Referral  When asked what else they would like to accomplish? She states: Adopt a healthier eating pattern and lifestyle and Improve energy levels and physical activity  When asked how has your weight affected you? She states: Other: none  Weight history: Current weight 170 lbs, ideal weight 160 lbs  Highest weight: 176 lbs  Some associated conditions: None  Contributing factors: hectic pace of life  Weight promoting medications identified: None  Prior weight loss attempts: None  Current nutrition plan: None  Current level of physical activity: Walking all day minutes, seven a week and Other: Tennis 2 x week  Current or previous pharmacotherapy: None  Response to medication: Never tried medications   Past medical history includes:  History reviewed. No pertinent past medical history.   Objective    BP 110/75   Pulse 84   Temp 98 F (36.7 C)   Ht 5' 3 (1.6 m)   Wt 170 lb (77.1 kg)   SpO2 96%   BMI 30.11 kg/m  She was weighed on the bioimpedance scale: Body mass index is 30.11 kg/m.  Body Fat%:36.1, Visceral Fat Rating:9, Weight trend over the last 12 months: Increasing  General:  Alert, oriented and cooperative. Patient is in no acute distress.  Respiratory: Normal respiratory effort, no problems with respiration noted   Gait: able to ambulate independently  Mental Status: Normal mood and affect. Normal behavior. Normal judgment and thought content.   DIAGNOSTIC DATA REVIEWED:  BMET    Component Value Date/Time   NA 140 05/05/2023 0855   K 4.1 05/05/2023 0855   CL 106 05/05/2023 0855    CO2 28 05/05/2023 0855   GLUCOSE 93 05/05/2023 0855   BUN 12 05/05/2023 0855   CREATININE 0.88 05/05/2023 0855   CALCIUM 9.9 05/05/2023 0855   GFRNONAA >60 05/05/2023 0855   Lab Results  Component Value Date   HGBA1C 5.2 05/05/2023   No results found for: INSULIN CBC    Component Value Date/Time   WBC 6.1 05/05/2023 0855   RBC 4.35 05/05/2023 0855   HGB 13.0 05/05/2023 0855   HCT 39.6 05/05/2023 0855   PLT 299 05/05/2023 0855   MCV 91.0 05/05/2023 0855   MCH 29.9 05/05/2023 0855   MCHC 32.8 05/05/2023 0855   RDW 11.7 05/05/2023 0855   Iron/TIBC/Ferritin/ %Sat No results found for: IRON, TIBC, FERRITIN, IRONPCTSAT Lipid Panel     Component Value Date/Time   CHOL 206 (H) 05/05/2023 0855   TRIG 48 05/05/2023 0855   HDL 54 05/05/2023 0855   CHOLHDL 3.8 05/05/2023 0855   VLDL 10 05/05/2023 0855   LDLCALC 142 (H) 05/05/2023 0855   Hepatic Function Panel     Component Value Date/Time   PROT 6.9 05/05/2023 0855   ALBUMIN 3.9 05/05/2023 0855   AST 21 05/05/2023 0855   ALT 19 05/05/2023 0855   ALKPHOS 55 05/05/2023 0855   BILITOT 0.9 05/05/2023 0855      Component Value Date/Time   TSH 3.128 05/05/2023 0855     Assessment and Plan   Healthcare maintenance  Obesity (BMI 30-39.9), STARTING BMI 30.2  Assessment and Plan          ESTABLISH WITH [facility]       Obesity Treatment / Action Plan:  Patient will work on garnering support from family and friends to begin weight loss journey. Will work on eliminating or reducing the presence of highly palatable, calorie dense foods in the home. Will complete provided nutritional and psychosocial assessment questionnaire before the next appointment. Will be scheduled for indirect calorimetry to determine resting energy expenditure in a fasting state.  This will allow us  to create a reduced calorie, high-protein meal plan to promote loss of fat mass while preserving muscle mass. Counseled on the health  benefits of losing 5%-15% of total body weight. Was counseled on nutritional approaches to weight loss and benefits of reducing processed foods and consuming plant-based foods and high quality protein as part of nutritional weight management. Was counseled on pharmacotherapy and role as an adjunct in weight management.   Obesity Education Performed Today:  She was weighed on the bioimpedance scale and results were discussed and documented in the synopsis.  We discussed obesity as a disease and the importance of a more detailed evaluation of all the factors contributing to the disease.  We discussed the importance of long term lifestyle changes which include nutrition, exercise and behavioral modifications as well as the importance of customizing this to her specific health and social needs.  We discussed the benefits of reaching a healthier weight to alleviate the symptoms of existing conditions and reduce the risks of the biomechanical, metabolic and psychological effects of obesity.  We reviewed the four pillars of obesity medicine and importance of using a multimodal approach.  We reviewed the basic principles in weight management.   Apphia R Apo appears to be in the action stage of change and states they are ready to start intensive lifestyle modifications and behavioral modifications.  I have spent 26 minutes in the care of the patient today including: 3 minutes before the visit reviewing and preparing the chart. 20 minutes face-to-face assessing and reviewing listed medical problems as outlined in obesity care plan, providing nutritional and behavioral counseling on topics outlined in the obesity care plan, counseling regarding anti-obesity medication as outlined in obesity care plan, independently interpreting test results and goals of care, as described in assessment and plan, and reviewing and discussing biometric information and progress 3 minutes after the visit updating chart and  documentation of encounter.  Reviewed by clinician on day of visit: allergies, medications, problem list, medical history, surgical history, family history, social history, and previous encounter notes pertinent to obesity diagnosis.  Chanteria Haggard d. Kannen Moxey, NP-C

## 2024-03-06 ENCOUNTER — Encounter (INDEPENDENT_AMBULATORY_CARE_PROVIDER_SITE_OTHER): Payer: Self-pay | Admitting: Internal Medicine

## 2024-03-06 ENCOUNTER — Ambulatory Visit (INDEPENDENT_AMBULATORY_CARE_PROVIDER_SITE_OTHER): Admitting: Internal Medicine

## 2024-03-06 VITALS — BP 125/80 | HR 69 | Temp 97.5°F | Ht 63.0 in | Wt 171.0 lb

## 2024-03-06 DIAGNOSIS — E559 Vitamin D deficiency, unspecified: Secondary | ICD-10-CM | POA: Insufficient documentation

## 2024-03-06 DIAGNOSIS — E7800 Pure hypercholesterolemia, unspecified: Secondary | ICD-10-CM | POA: Diagnosis not present

## 2024-03-06 DIAGNOSIS — R0602 Shortness of breath: Secondary | ICD-10-CM

## 2024-03-06 DIAGNOSIS — R5383 Other fatigue: Secondary | ICD-10-CM | POA: Diagnosis not present

## 2024-03-06 DIAGNOSIS — Z6830 Body mass index (BMI) 30.0-30.9, adult: Secondary | ICD-10-CM

## 2024-03-06 DIAGNOSIS — Z1331 Encounter for screening for depression: Secondary | ICD-10-CM | POA: Diagnosis not present

## 2024-03-06 DIAGNOSIS — E66811 Obesity, class 1: Secondary | ICD-10-CM | POA: Diagnosis not present

## 2024-03-06 NOTE — Assessment & Plan Note (Signed)
 Class 1 obesity with a body fat percentage of 37%, above the desirable range of 23-33% for her age, height, and weight. No history of prediabetes, insulin  resistance, fatty liver disease, or sleep apnea. No joint pain reported. Premature menopause may contribute to weight gain due to hormonal changes. Current lifestyle includes eating out 4-5 times a week, fast food consumption, and emotional eating. Metabolic rate is normal at 1382 calories, with a calculated maintenance rate of 1461 calories. Discussed the importance of a calorie deficit for weight loss and the role of nutrition and lifestyle changes. Emphasized the benefits of a high-protein, low-carb diet and the potential use of meal replacements. Discussed the potential for medication if lifestyle changes are insufficient, but current focus is on lifestyle modification. - Provided handouts on top 10 principles for weight loss, processed foods, and eating out. - Recommended a 1000 calorie meal plan with high protein and low carbs. - Encouraged use of AI tools like ChatGPT for meal planning. - Advised on increasing physical activity to 150 minutes per week. - Discussed potential use of meal replacements for convenience. - Encouraged tracking and journaling of food intake. - Advised on reducing fast food and processed food consumption. - Discussed the importance of hydration and reducing liquid calories. - Recommended considering bone density testing due to premature menopause.

## 2024-03-06 NOTE — Assessment & Plan Note (Signed)
 LDL is not at goal. Elevated LDL may be secondary to nutrition, genetics and spillover effect from excess adiposity. Recommended LDL goal is <70 to reduce the risk of fatty streaks and the progression to obstructive ASCVD in the future.   Her 10 year risk is: The 10-year ASCVD risk score (Arnett DK, et al., 2019) is: 2.7%  Lab Results  Component Value Date   CHOL 206 (H) 05/05/2023   HDL 54 05/05/2023   LDLCALC 142 (H) 05/05/2023   TRIG 48 05/05/2023   CHOLHDL 3.8 05/05/2023    Continue weight loss therapy, losing 10% or more of body weight may improve condition. Also advised to reduce saturated fats in diet to less than 10% of daily calories.   Hypercholesterolemia, possibly genetic or nutritional. No current medications. Discussed the potential for weight loss and dietary changes to improve cholesterol levels. Consideration of additional cardiovascular risk assessment tests, including high sensitivity CRP and LPA test, to evaluate genetic predisposition to atherogenic cholesterol. Emphasized the importance of understanding cardiovascular risk to mitigate future complications. - Ordered fasting cholesterol test. - Ordered high sensitivity CRP and LPA test for cardiovascular risk assessment. - Repeated A1c and checked insulin levels. - Performed blood count. - Discussed potential need for medication if cardiovascular risk is high.

## 2024-03-06 NOTE — Progress Notes (Signed)
 [address] [place],  [place], [place] [address]  Office: [phone]  /  Fax: [phone]   Subjective   Initial Visit  Kelly Rivers (MR# [id number]) is a 58 y.o. female who presents for evaluation and treatment of obesity and related comorbidities. Current BMI is Body mass index is 30.11 kg/m. Kelly Rivers has been struggling with her weight for many years and has been unsuccessful in either losing weight, maintaining weight loss, or reaching her healthy weight goal.  Kelly Rivers is currently in the action stage of change and ready to dedicate time achieving and maintaining a healthier weight. Kelly Rivers is interested in becoming our patient and working on intensive lifestyle modifications including (but not limited to) diet and exercise for weight loss.  Weight history:  Discussed the use of AI scribe software for clinical note transcription with the patient, who gave verbal consent to proceed.  History of Present Illness Kelly Rivers is a 58 year old female who presents for a new intake appointment for medical weight management.  She has a history of hypercholesterolemia, which she believes may be genetic, as her mother also had high cholesterol. She has not experienced complications from her four pregnancies, although one child was born weighing over nine pounds. She reached menopause in her late forties and has been screened for osteoporosis.  Her highest weight was 176 pounds, and she currently has a body fat percentage of 37%, which is above the desirable range for her age and height. She is not on any medications and has no history of prediabetes, insulin resistance, fatty liver disease, or sleep apnea. No joint pain, knee pain, or back pain.  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 She lives with her son and reports a supportive home environment. She walks twice a week for 30 minutes and gets plenty of steps at work. She aims to reduce  her weight to 160 pounds.  Her dietary habits include eating out and consuming fast food 4-5 times a week. She often skips breakfast and tends to snack on chips and crackers. She drinks diet soda once a day and has coffee with creamer. She consumes alcohol in the form of a margarita once a week. She identifies fast food as her worst dietary habit and acknowledges emotional eating, particularly when stressed or tired.  In her family history, her mother had high cholesterol and anxiety, her father had alcohol problems, and her half-siblings struggled with weight and substance issues. Her younger brother is fit and active.     Physical Activity:  Current level of physical activity: NEAT  Barriers to Exercise: time   Past medical history includes:   Past Medical History:  Diagnosis Date   Constipation    Joint pain      Objective   Ht 5' 3 (1.6 m)   BMI 30.11 kg/m  She was weighed on the bioimpedance scale: Body mass index is 30.11 kg/m.    Anthropometrics:  No data recorded Anthropometric Measurements Height: 5' 3 (1.6 m) Peak Weight: 176 lb   No data recorded Other Clinical Data Fasting: yes Labs: yes Today's Visit #: 1 Starting Date: 03/06/24    Physical Exam:  General: She is overweight, cooperative, alert, well developed, and in no acute distress. PSYCH: Has normal mood, affect and thought process.   HEENT: EOMI, sclerae are anicteric. Lungs: Normal breathing effort, no conversational dyspnea. Extremities: No edema.  Neurologic: No gross sensory or motor  deficits. No tremors or fasciculations noted.    Diagnostic Data Reviewed  EKG: Normal sinus rhythm, rate 60 bpm. No conduction abnormalities, abnormal Q waves or chamber enlargement.  Indirect Calorimeter completed today shows a VO2 of 200 and a REE of 1382.  Her calculated basal metabolic rate is 8538 thus her resting energy expenditure slower than calculated.  Depression Screen  Kelly Rivers's PHQ-9  score was: 1.     03/06/2024    6:58 AM  Depression screen PHQ 2/9  Decreased Interest 0  Down, Depressed, Hopeless 0  PHQ - 2 Score 0  Altered sleeping 0  Tired, decreased energy 1  Change in appetite 0  Feeling bad or failure about yourself  0  Trouble concentrating 0  Moving slowly or fidgety/restless 0  Suicidal thoughts 0  PHQ-9 Score 1  Difficult doing work/chores Not difficult at all    Screening for Sleep Related Breathing Disorders  Kelly Rivers denies daytime somnolence and denies waking up still tired. Patient denies a history of symptoms of OSA. Kelly Rivers generally gets 6 to 6 1/2 hours of sleep per night, and states that she has generally restful sleep. Snoring is not present. Apneic episodes are not present. Epworth Sleepiness Score is 4.   BMET    Component Value Date/Time   NA 140 05/05/2023 0855   K 4.1 05/05/2023 0855   CL 106 05/05/2023 0855   CO2 28 05/05/2023 0855   GLUCOSE 93 05/05/2023 0855   BUN 12 05/05/2023 0855   CREATININE 0.88 05/05/2023 0855   CALCIUM 9.9 05/05/2023 0855   GFRNONAA >60 05/05/2023 0855   Lab Results  Component Value Date   HGBA1C 5.2 05/05/2023   No results found for: INSULIN  CBC    Component Value Date/Time   WBC 6.1 05/05/2023 0855   RBC 4.35 05/05/2023 0855   HGB 13.0 05/05/2023 0855   HCT 39.6 05/05/2023 0855   PLT 299 05/05/2023 0855   MCV 91.0 05/05/2023 0855   MCH 29.9 05/05/2023 0855   MCHC 32.8 05/05/2023 0855   RDW 11.7 05/05/2023 0855   Iron/TIBC/Ferritin/ %Sat No results found for: IRON, TIBC, FERRITIN, IRONPCTSAT Lipid Panel     Component Value Date/Time   CHOL 206 (H) 05/05/2023 0855   TRIG 48 05/05/2023 0855   HDL 54 05/05/2023 0855   CHOLHDL 3.8 05/05/2023 0855   VLDL 10 05/05/2023 0855   LDLCALC 142 (H) 05/05/2023 0855   Hepatic Function Panel     Component Value Date/Time   PROT 6.9 05/05/2023 0855   ALBUMIN 3.9 05/05/2023 0855   AST 21 05/05/2023 0855   ALT 19 05/05/2023 0855    ALKPHOS 55 05/05/2023 0855   BILITOT 0.9 05/05/2023 0855      Component Value Date/Time   TSH 3.128 05/05/2023 0855     Assessment and Plan   TREATMENT PLAN FOR OBESITY:  Recommended Dietary Goals  Kelly Rivers is currently in the action stage of change. As such, her goal is to implement medically supervised obesity management plan.  She has agreed to implement: the Category 1 plan - 1000 kcal per day  Behavioral Intervention  We discussed the following Behavioral Modification Strategies today: increasing lean protein intake to established goals, decreasing simple carbohydrates , increasing vegetables, increasing lower glycemic fruits, increasing fiber rich foods, avoiding skipping meals, increasing water intake, work on meal planning and preparation, work on tracking and journaling calories using tracking application, reading food labels , keeping healthy foods at home, identifying sources and decreasing liquid calories,  decreasing eating out or consumption of processed foods, and making healthy choices when eating convenient foods, planning for success, and better snacking choices  Additional resources provided today: Handout on healthy eating and balanced plate, Handout on complex carbohydrates and lean sources of protein, Handout on traveling and holiday eating strategies, Category 1 packet, and Handout principles of weight management  Recommended Physical Activity Goals  Kelly Rivers has been advised to work up to 150 minutes of moderate intensity aerobic activity a week and strengthening exercises 2-3 times per week for cardiovascular health, weight loss maintenance and preservation of muscle mass.   She has agreed to :  Think about enjoyable ways to increase daily physical activity and overcoming barriers to exercise and Combine aerobic and strengthening exercises for efficiency and improved cardiometabolic health.  Medical Interventions and Pharmacotherapy We will work on building a  therapist, art and behavioral strategies. We will discuss the role of pharmacotherapy as an adjunct at subsequent visits.   ASSOCIATED CONDITIONS ADDRESSED TODAY  Other Fatigue  Kelly Rivers denies daytime somnolence and denies waking up still tired. Patient denies a history of symptoms of OSA. Kelly Rivers generally gets 6 to 6 1/2 hours of sleep per night, and states that she has generally restful sleep. Snoring is not present. Apneic episodes are not present. Epworth Sleepiness Score is 4. Kelly Rivers does feel that her weight is causing her energy to be lower than it should be. Fatigue may be related to obesity, depression or many other causes. Labs will be ordered, and in the meanwhile, Kelly Rivers will focus on self care including making healthy food choices, increasing physical activity and focusing on stress reduction.  Shortness of Breath Kelly Rivers notes increasing shortness of breath with physical activity and seems to be worsening over time with weight gain. She notes getting out of breath sooner with activity than she used to. This has not gotten worse recently. Kelly Rivers denies shortness of breath at rest or orthopnea.  Assessment & Plan Other fatigue  SOB (shortness of breath) on exertion  Depression screen  Pure hypercholesterolemia LDL is not at goal. Elevated LDL may be secondary to nutrition, genetics and spillover effect from excess adiposity. Recommended LDL goal is <70 to reduce the risk of fatty streaks and the progression to obstructive ASCVD in the future.   Her 10 year risk is: The 10-year ASCVD risk score (Arnett DK, et al., 2019) is: 2.7%  Lab Results  Component Value Date   CHOL 206 (H) 05/05/2023   HDL 54 05/05/2023   LDLCALC 142 (H) 05/05/2023   TRIG 48 05/05/2023   CHOLHDL 3.8 05/05/2023    Continue weight loss therapy, losing 10% or more of body weight may improve condition. Also advised to reduce saturated fats in diet to less than 10% of daily calories.    Hypercholesterolemia, possibly genetic or nutritional. No current medications. Discussed the potential for weight loss and dietary changes to improve cholesterol levels. Consideration of additional cardiovascular risk assessment tests, including high sensitivity CRP and LPA test, to evaluate genetic predisposition to atherogenic cholesterol. Emphasized the importance of understanding cardiovascular risk to mitigate future complications. - Ordered fasting cholesterol test. - Ordered high sensitivity CRP and LPA test for cardiovascular risk assessment. - Repeated A1c and checked insulin levels. - Performed blood count. - Discussed potential need for medication if cardiovascular risk is high.   Vitamin D  deficiency Most recent vitamin D  levels  Lab Results  Component Value Date   VD25OH 18.34 (L) 05/05/2023  Deficiency state associated with adiposity and may result in leptin resistance, weight gain and fatigue. Currently on vitamin D  supplementation without any adverse effects.  Plan: Check vitamin D  levels to make sure her levels are replenished.  Class 1 obesity with serious comorbidity and body mass index (BMI) of 30.0 to 30.9 in adult, unspecified obesity type Class 1 obesity with a body fat percentage of 37%, above the desirable range of 23-33% for her age, height, and weight. No history of prediabetes, insulin  resistance, fatty liver disease, or sleep apnea. No joint pain reported. Premature menopause may contribute to weight gain due to hormonal changes. Current lifestyle includes eating out 4-5 times a week, fast food consumption, and emotional eating. Metabolic rate is normal at 1382 calories, with a calculated maintenance rate of 1461 calories. Discussed the importance of a calorie deficit for weight loss and the role of nutrition and lifestyle changes. Emphasized the benefits of a high-protein, low-carb diet and the potential use of meal replacements. Discussed the potential for  medication if lifestyle changes are insufficient, but current focus is on lifestyle modification. - Provided handouts on top 10 principles for weight loss, processed foods, and eating out. - Recommended a 1000 calorie meal plan with high protein and low carbs. - Encouraged use of AI tools like ChatGPT for meal planning. - Advised on increasing physical activity to 150 minutes per week. - Discussed potential use of meal replacements for convenience. - Encouraged tracking and journaling of food intake. - Advised on reducing fast food and processed food consumption. - Discussed the importance of hydration and reducing liquid calories. - Recommended considering bone density testing due to premature menopause.   Recording duration 43 minutes   Follow-up  She was informed of the importance of frequent follow-up visits to maximize her success with intensive lifestyle modifications for her multiple health conditions. She was informed we would discuss her lab results at her next visit unless there is a critical issue that needs to be addressed sooner. Kelly Rivers agreed to keep her next visit at the agreed upon time to discuss these results.  Attestation Statement  This is the patient's intake visit at [facility]. The patient's Health Questionnaire was reviewed at length. Included in the packet: current and past health history, medications, allergies, ROS, gynecologic history (women only), surgical history, family history, social history, weight history, weight loss surgery history (for those that have had weight loss surgery), nutritional evaluation, mood and food questionnaire, PHQ9, Epworth questionnaire, sleep habits questionnaire, patient life and health improvement goals questionnaire. These will all be scanned into the patient's chart under media.   During the visit, I independently reviewed the patient's EKG, previous labs, bioimpedance scale results, and indirect calorimetry results.  I used this information to medically tailor a meal plan for the patient that will help her to lose weight and will improve her obesity-related conditions. I performed a medically necessary appropriate examination and/or evaluation. I discussed the assessment and treatment plan with the patient. The patient was provided an opportunity to ask questions and all were answered. The patient agreed with the plan and demonstrated an understanding of the instructions. Labs were ordered at this visit and will be reviewed at the next visit unless critical results need to be addressed immediately. Clinical information was updated and documented in the EMR.   In addition, they received basic education on identification of processed foods and reduction of these, different sources of lean proteins and complex carbohydrates and how to eat  balanced by incorporation of whole foods.  Reviewed by clinician on day of visit: allergies, medications, problem list, medical history, surgical history, family history, social history, and previous encounter notes.  I have spent 56 minutes in the care of the patient today including: 5 minutes before the visit reviewing and preparing the chart. 43 minutes face-to-face assessing and reviewing listed medical problems as outlined in obesity care plan, providing nutritional and behavioral counseling on topics outlined in the obesity care plan, independently interpreting test results and goals of care, as described in assessment and plan, reviewing and discussing biometric information and progress, and ordering diagnostics - see orders 8 minutes after the visit updating chart and documentation of encounter.       Lucas Parker, MD

## 2024-03-06 NOTE — Assessment & Plan Note (Signed)
 Most recent vitamin D  levels  Lab Results  Component Value Date   VD25OH 18.34 (L) 05/05/2023     Deficiency state associated with adiposity and may result in leptin resistance, weight gain and fatigue. Currently on vitamin D  supplementation without any adverse effects.  Plan: Check vitamin D  levels to make sure her levels are replenished.

## 2024-03-08 LAB — CBC WITH DIFFERENTIAL/PLATELET
Basophils Absolute: 0 x10E3/uL (ref 0.0–0.2)
Basos: 1 %
EOS (ABSOLUTE): 0.1 x10E3/uL (ref 0.0–0.4)
Eos: 1 %
Hematocrit: 42.1 % (ref 34.0–46.6)
Hemoglobin: 13.4 g/dL (ref 11.1–15.9)
Immature Grans (Abs): 0 x10E3/uL (ref 0.0–0.1)
Immature Granulocytes: 0 %
Lymphocytes Absolute: 1.7 x10E3/uL (ref 0.7–3.1)
Lymphs: 28 %
MCH: 30 pg (ref 26.6–33.0)
MCHC: 31.8 g/dL (ref 31.5–35.7)
MCV: 94 fL (ref 79–97)
Monocytes Absolute: 0.5 x10E3/uL (ref 0.1–0.9)
Monocytes: 8 %
Neutrophils Absolute: 3.9 x10E3/uL (ref 1.4–7.0)
Neutrophils: 62 %
Platelets: 300 x10E3/uL (ref 150–450)
RBC: 4.46 x10E6/uL (ref 3.77–5.28)
RDW: 11.7 % (ref 11.7–15.4)
WBC: 6.2 x10E3/uL (ref 3.4–10.8)

## 2024-03-08 LAB — COMPREHENSIVE METABOLIC PANEL WITH GFR
ALT: 34 IU/L — ABNORMAL HIGH (ref 0–32)
AST: 34 IU/L (ref 0–40)
Albumin: 4.5 g/dL (ref 3.8–4.9)
Alkaline Phosphatase: 70 IU/L (ref 49–135)
BUN/Creatinine Ratio: 15 (ref 9–23)
BUN: 13 mg/dL (ref 6–24)
Bilirubin Total: 0.6 mg/dL (ref 0.0–1.2)
CO2: 24 mmol/L (ref 20–29)
Calcium: 10.2 mg/dL (ref 8.7–10.2)
Chloride: 103 mmol/L (ref 96–106)
Creatinine, Ser: 0.86 mg/dL (ref 0.57–1.00)
Globulin, Total: 2.5 g/dL (ref 1.5–4.5)
Glucose: 83 mg/dL (ref 70–99)
Potassium: 4.5 mmol/L (ref 3.5–5.2)
Sodium: 140 mmol/L (ref 134–144)
Total Protein: 7 g/dL (ref 6.0–8.5)
eGFR: 78 mL/min/1.73 (ref >59)

## 2024-03-08 LAB — HIGH SENSITIVITY CRP: CRP, High Sensitivity: 1.04 mg/L (ref 0.00–3.00)

## 2024-03-08 LAB — INSULIN, RANDOM: INSULIN: 10.9 u[IU]/mL (ref 2.6–24.9)

## 2024-03-08 LAB — LIPID PANEL WITH LDL/HDL RATIO
Cholesterol, Total: 215 mg/dL — ABNORMAL HIGH (ref 100–199)
HDL: 54 mg/dL (ref >39)
LDL Chol Calc (NIH): 145 mg/dL — ABNORMAL HIGH (ref 0–99)
LDL/HDL Ratio: 2.7 ratio (ref 0.0–3.2)
Triglycerides: 89 mg/dL (ref 0–149)
VLDL Cholesterol Cal: 16 mg/dL (ref 5–40)

## 2024-03-08 LAB — LIPOPROTEIN A (LPA): Lipoprotein (a): <8.4 nmol/L (ref <75.0)

## 2024-03-08 LAB — HEMOGLOBIN A1C
Est. average glucose Bld gHb Est-mCnc: 108 mg/dL
Hgb A1c MFr Bld: 5.4 % (ref 4.8–5.6)

## 2024-03-08 LAB — VITAMIN B12: Vitamin B-12: 332 pg/mL (ref 232–1245)

## 2024-03-08 LAB — TSH: TSH: 3.22 u[IU]/mL (ref 0.450–4.500)

## 2024-03-08 LAB — VITAMIN D 25 HYDROXY (VIT D DEFICIENCY, FRACTURES): Vit D, 25-Hydroxy: 22.3 ng/mL — ABNORMAL LOW (ref 30.0–100.0)

## 2024-03-20 ENCOUNTER — Ambulatory Visit (INDEPENDENT_AMBULATORY_CARE_PROVIDER_SITE_OTHER): Admitting: Internal Medicine

## 2024-03-20 ENCOUNTER — Encounter (INDEPENDENT_AMBULATORY_CARE_PROVIDER_SITE_OTHER): Payer: Self-pay | Admitting: Internal Medicine

## 2024-03-20 VITALS — BP 133/86 | HR 72 | Temp 98.1°F | Ht 63.0 in | Wt 173.0 lb

## 2024-03-20 DIAGNOSIS — E559 Vitamin D deficiency, unspecified: Secondary | ICD-10-CM

## 2024-03-20 DIAGNOSIS — E66811 Obesity, class 1: Secondary | ICD-10-CM | POA: Diagnosis not present

## 2024-03-20 DIAGNOSIS — E7800 Pure hypercholesterolemia, unspecified: Secondary | ICD-10-CM

## 2024-03-20 DIAGNOSIS — R7401 Elevation of levels of liver transaminase levels: Secondary | ICD-10-CM

## 2024-03-20 DIAGNOSIS — Z6830 Body mass index (BMI) 30.0-30.9, adult: Secondary | ICD-10-CM | POA: Diagnosis not present

## 2024-03-20 NOTE — Progress Notes (Unsigned)
 Office: [phone]  /  Fax: [phone]  Weight Summary and Body Composition Analysis (BIA)  Vitals Temp: 98.1 F (36.7 C) BP: 133/86 Pulse Rate: 72 SpO2: 98 %   Anthropometric Measurements Height: 5' 3 (1.6 m) Weight: 173 lb (78.5 kg) BMI (Calculated): 30.65 Weight at Last Visit: 0 lb Weight Lost Since Last Visit: 171 lb Weight Gained Since Last Visit: 2 lb Starting Weight: 171 lb Total Weight Loss (lbs): 0 lb (0 kg) Peak Weight: 176 lb   Body Composition  Body Fat %: 37.6 % Fat Mass (lbs): 65.2 lbs Muscle Mass (lbs): 108.2 lbs Total Body Water (lbs): 73 lbs Visceral Fat Rating : 10   The 10-year ASCVD risk score (Arnett DK, et al., 2019) is: 3.1%   RMR: 1382  Today's Visit #: 2  Starting Date: 03/06/24   Subjective   Chief Complaint: Obesity  Interval History Discussed the use of AI scribe software for clinical note transcription with the patient, who gave verbal consent to proceed.  History of Present Illness Kelly Rivers is a 58 year old female who presents for a post intake appointment for medical weight management.  She has become more aware of her dietary habits, especially after reviewing her cholesterol levels. She has reduced her intake of fried foods and fast food, opting for healthier options like salads, baked potatoes, and chili. She has started logging her food intake more consistently, although she finds it challenging to maintain these habits due to stress and fatigue, particularly in the evenings and during the holidays.  A significant change in her living situation has impacted her ability to prepare meals, as she moved from a 5,000 square foot house to a 1,000 square foot temporary home. She anticipates improvement once she moves back into her house in the next one to two weeks. She has attended wellness classes focused on cardiovascular health and cholesterol management and has resumed taking vitamins, though not as regularly  as in the past.  She is working on maintaining a calorie deficit, aiming to stay under 1,400 calories per day, which she finds mentally challenging but manageable. She acknowledges the need to increase her protein intake to meet recommended levels. She has been using a timer to remind herself to drink water throughout the day, although she struggles with consistency in the evenings.  She has a family history of liver issues, including an uncle with fatty liver disease. She does not currently take any medications or supplements regularly but is considering incorporating more structured meal replacements and protein shakes into her diet to help manage her weight and nutritional intake.  Her review of symptoms includes no significant issues with her current nutrition, as she ensures she does not skip meals. She typically eats breakfast around 9:45 AM, lunch around 2 PM, and dinner varies depending on her schedule. She noted that her blood pressure was slightly elevated during today's visit, which she attributed to stress. She has a history of vitamin D  deficiency but has not been consistent with supplementation.     Challenges affecting patient progress: none.    Pharmacotherapy for weight management: She is currently taking no anti-obesity medication and patient has declined pharmacotherapy in past.   Assessment and Plan   Treatment Plan For Obesity:  Recommended Dietary Goals  Kelly Rivers is currently in the action stage of change. As such, her goal is to continue weight management plan. She has agreed to: continue current plan  Behavioral Health and Counseling  We discussed  the following behavioral modification strategies today: continue to work on maintaining a reduced calorie state, getting the recommended amount of protein, incorporating whole foods, making healthy choices, staying well hydrated and practicing mindfulness when eating. and increase protein intake, fibrous foods (25 grams per day  for women, 30 grams for men) and water to improve satiety and decrease hunger signals. .  Additional education and resources provided today: None  Recommended Physical Activity Goals  Kelly Rivers has been advised to work up to 150 minutes of moderate intensity aerobic activity a week and strengthening exercises 2-3 times per week for cardiovascular health, weight loss maintenance and preservation of muscle mass.  She has agreed to :  Think about enjoyable ways to increase daily physical activity and overcoming barriers to exercise, Increase physical activity in their day and reduce sedentary time (increase NEAT)., Increase volume of physical activity to a goal of 240 minutes a week, and Combine aerobic and strengthening exercises for efficiency and improved cardiometabolic health.  Medical Interventions and Pharmacotherapy  We discussed various medication options to help Kelly Rivers with her weight loss efforts and we both agreed to : Continue with current nutritional and behavioral strategies and Has declined pharmacotherapy  Associated Conditions Impacted by Obesity Treatment  Assessment & Plan Class 1 obesity with serious comorbidity and body mass index (BMI) of 30.0 to 30.9 in adult, unspecified obesity type Actively engaged in weight management with dietary changes, including reduced fried food intake and increased awareness of protein consumption. Challenges include stress-related eating and difficulty maintaining a consistent routine due to recent living situation changes. Emphasized the importance of a calorie deficit for weight loss and discussed strategies such as meal replacements and tracking food intake. Highlighted the role of protein in preventing muscle loss, especially during menopause. - Continue dietary modifications with focus on reducing fried foods and increasing protein intake. - Consider meal replacements like plant-based options with fiber and natural sweeteners. - Utilize apps  like [employer] for scanning food labels to avoid additives. - Explore prepackaged meal services like [employer]al Prep for convenience. - Incorporate chia seeds into protein shakes for added fiber - Monitor water intake and maintain hydration. - Attend wellness classes focusing on cardiovascular health and cholesterol management. -Labs did not show any signs of glucose metabolic disorders Pure hypercholesterolemia LDL is not at goal. Elevated LDL may be secondary to nutrition, genetics and spillover effect from excess adiposity. Recommended LDL goal is <70 to reduce the risk of fatty streaks and the progression to obstructive ASCVD in the future.   Her 10 year risk is: The 10-year ASCVD risk score (Arnett DK, et al., 2019) is: 3.1%  Lab Results  Component Value Date   CHOL 215 (H) 03/06/2024   HDL 54 03/06/2024   LDLCALC 145 (H) 03/06/2024   TRIG 89 03/06/2024   CHOLHDL 3.8 05/05/2023   She has normal high-sensitivity CRP and negative LPA.  She is concerned about her cardiovascular risk.  She has slight elevation in blood pressure and has an elevated LDL cholesterol.  We recommend further risk stratification with coronary artery calcium score which she agrees with.   Vitamin D  deficiency Discussed the importance of vitamin D  for immune function, bone health, and weight management. Recommended over-the-counter vitamin D3 supplementation. - Take over-the-counter vitamin D3, 2000 IU daily. Elevated ALT measurement Mild elevation in liver enzymes, possibly indicating fatty liver. Discussed potential causes including weight gain, alcohol consumption, and dietary factors. No immediate need for intervention, but lifestyle changes are recommended  to prevent progression. - She would like to hold off ultrasound imaging of the liver and further serological workup for causes of hepatic steatosis - Continue weight loss efforts and dietary modifications to reduce liver fat. -Discussed the role of  saturated fats and added sugars and artificial sweeteners and fatty liver.         Objective   Physical Exam:  Blood pressure 133/86, pulse 72, temperature 98.1 F (36.7 C), height 5' 3 (1.6 m), weight 173 lb (78.5 kg), SpO2 98%. Body mass index is 30.65 kg/m.  General: She is overweight, cooperative, alert, well developed, and in no acute distress. PSYCH: Has normal mood, affect and thought process.   HEENT: EOMI, sclerae are anicteric. Lungs: Normal breathing effort, no conversational dyspnea. Extremities: No edema.  Neurologic: No gross sensory or motor deficits. No tremors or fasciculations noted.    Diagnostic Data Reviewed:  BMET    Component Value Date/Time   NA 140 03/06/2024 0852   K 4.5 03/06/2024 0852   CL 103 03/06/2024 0852   CO2 24 03/06/2024 0852   GLUCOSE 83 03/06/2024 0852   GLUCOSE 93 05/05/2023 0855   BUN 13 03/06/2024 0852   CREATININE 0.86 03/06/2024 0852   CALCIUM 10.2 03/06/2024 0852   GFRNONAA >60 05/05/2023 0855   Lab Results  Component Value Date   HGBA1C 5.4 03/06/2024   HGBA1C 5.2 05/05/2023   Lab Results  Component Value Date   INSULIN  10.9 03/06/2024   Lab Results  Component Value Date   TSH 3.220 03/06/2024   CBC    Component Value Date/Time   WBC 6.2 03/06/2024 0852   WBC 6.1 05/05/2023 0855   RBC 4.46 03/06/2024 0852   RBC 4.35 05/05/2023 0855   HGB 13.4 03/06/2024 0852   HCT 42.1 03/06/2024 0852   PLT 300 03/06/2024 0852   MCV 94 03/06/2024 0852   MCH 30.0 03/06/2024 0852   MCH 29.9 05/05/2023 0855   MCHC 31.8 03/06/2024 0852   MCHC 32.8 05/05/2023 0855   RDW 11.7 03/06/2024 0852   Iron Studies No results found for: IRON, TIBC, FERRITIN, IRONPCTSAT Lipid Panel     Component Value Date/Time   CHOL 215 (H) 03/06/2024 0852   TRIG 89 03/06/2024 0852   HDL 54 03/06/2024 0852   CHOLHDL 3.8 05/05/2023 0855   VLDL 10 05/05/2023 0855   LDLCALC 145 (H) 03/06/2024 0852   Hepatic Function Panel      Component Value Date/Time   PROT 7.0 03/06/2024 0852   ALBUMIN 4.5 03/06/2024 0852   AST 34 03/06/2024 0852   ALT 34 (H) 03/06/2024 0852   ALKPHOS 70 03/06/2024 0852   BILITOT 0.6 03/06/2024 0852      Component Value Date/Time   TSH 3.220 03/06/2024 0852   Nutritional Lab Results  Component Value Date   VD25OH 22.3 (L) 03/06/2024   VD25OH 18.34 (L) 05/05/2023    Medications: No outpatient encounter medications on file as of 03/20/2024.   No facility-administered encounter medications on file as of 03/20/2024.     Follow-Up   Return in about 4 weeks (around 04/17/2024) for For Weight Mangement with Dr. Francyne.[email or web address] She was informed of the importance of frequent follow up visits to maximize her success with intensive lifestyle modifications for her multiple health conditions.  Attestation Statement   Reviewed by clinician on day of visit: allergies, medications, problem list, medical history, surgical history, family history, social history, and previous encounter notes.     Lucas Francyne, MD

## 2024-03-21 NOTE — Assessment & Plan Note (Signed)
 Actively engaged in weight management with dietary changes, including reduced fried food intake and increased awareness of protein consumption. Challenges include stress-related eating and difficulty maintaining a consistent routine due to recent living situation changes. Emphasized the importance of a calorie deficit for weight loss and discussed strategies such as meal replacements and tracking food intake. Highlighted the role of protein in preventing muscle loss, especially during menopause. - Continue dietary modifications with focus on reducing fried foods and increasing protein intake. - Consider meal replacements like plant-based options with fiber and natural sweeteners. - Utilize apps like [employer]uka for scanning food labels to avoid additives. - Explore prepackaged meal services like [employer] Prep for convenience. - Incorporate chia seeds into protein shakes for added fiber - Monitor water intake and maintain hydration. - Attend wellness classes focusing on cardiovascular health and cholesterol management. -Labs did not show any signs of glucose metabolic disorders

## 2024-03-21 NOTE — Assessment & Plan Note (Signed)
 Discussed the importance of vitamin D  for immune function, bone health, and weight management. Recommended over-the-counter vitamin D3 supplementation. - Take over-the-counter vitamin D3, 2000 IU daily.

## 2024-03-21 NOTE — Assessment & Plan Note (Signed)
 LDL is not at goal. Elevated LDL may be secondary to nutrition, genetics and spillover effect from excess adiposity. Recommended LDL goal is <70 to reduce the risk of fatty streaks and the progression to obstructive ASCVD in the future.   Her 10 year risk is: The 10-year ASCVD risk score (Arnett DK, et al., 2019) is: 3.1%  Lab Results  Component Value Date   CHOL 215 (H) 03/06/2024   HDL 54 03/06/2024   LDLCALC 145 (H) 03/06/2024   TRIG 89 03/06/2024   CHOLHDL 3.8 05/05/2023   She has normal high-sensitivity CRP and negative LPA.  She is concerned about her cardiovascular risk.  She has slight elevation in blood pressure and has an elevated LDL cholesterol.  We recommend further risk stratification with coronary artery calcium score which she agrees with.

## 2024-04-30 ENCOUNTER — Ambulatory Visit (INDEPENDENT_AMBULATORY_CARE_PROVIDER_SITE_OTHER): Admitting: Internal Medicine

## 2024-04-30 ENCOUNTER — Encounter (INDEPENDENT_AMBULATORY_CARE_PROVIDER_SITE_OTHER): Payer: Self-pay | Admitting: Internal Medicine

## 2024-04-30 VITALS — BP 121/82 | HR 72 | Temp 97.6°F | Ht 63.0 in | Wt 172.0 lb

## 2024-04-30 DIAGNOSIS — E7800 Pure hypercholesterolemia, unspecified: Secondary | ICD-10-CM | POA: Diagnosis not present

## 2024-04-30 DIAGNOSIS — R7401 Elevation of levels of liver transaminase levels: Secondary | ICD-10-CM | POA: Diagnosis not present

## 2024-04-30 DIAGNOSIS — E66811 Obesity, class 1: Secondary | ICD-10-CM | POA: Diagnosis not present

## 2024-04-30 DIAGNOSIS — Z6830 Body mass index (BMI) 30.0-30.9, adult: Secondary | ICD-10-CM

## 2024-04-30 NOTE — Assessment & Plan Note (Signed)
 LDL is not at goal. Elevated LDL may be secondary to nutrition, genetics and spillover effect from excess adiposity. Recommended LDL goal is <70 to reduce the risk of fatty streaks and the progression to obstructive ASCVD in the future.   Her 10 year risk is: The 10-year ASCVD risk score (Arnett DK, et al., 2019) is: 2.9%  Lab Results  Component Value Date   CHOL 215 (H) 03/06/2024   HDL 54 03/06/2024   LDLCALC 145 (H) 03/06/2024   TRIG 89 03/06/2024   CHOLHDL 3.8 05/05/2023   She has normal high-sensitivity CRP and negative LPA.  Continue reducing saturated fats in diet also simple and added sugars.
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
## 2024-04-30 NOTE — Assessment & Plan Note (Signed)
 BMI of 30 with cardiometabolic abnormalities. She is not on anti-obesity medications and is making lifestyle changes, including dietary adjustments and increased awareness of stress eating. Discussed potential use of GLP-1 receptor agonists for weight management, including benefits such as appetite suppression and inhibitory control, and side effects l.. Emphasized the importance of lifestyle changes alongside medication for sustainable weight loss. Discussed the role of GLP-1 receptor agonists in reducing cardiovascular risk and using the risk of hepatic steatosis. - Continue lifestyle modifications including dietary changes and increased physical activity. - Will consider GLP-1 receptor agonists for weight management if lifestyle changes are insufficient. - Scheduled follow-up in 4 weeks to monitor progress and discuss further management options

## 2024-05-13 ENCOUNTER — Other Ambulatory Visit (HOSPITAL_COMMUNITY)

## 2024-05-29 ENCOUNTER — Ambulatory Visit (INDEPENDENT_AMBULATORY_CARE_PROVIDER_SITE_OTHER): Admitting: Internal Medicine
